# Patient Record
Sex: Female | Born: 1950
Health system: Southern US, Community
[De-identification: ages and names within clinical notes are randomized; demographics above are authoritative.]

## PROBLEM LIST (undated history)

## (undated) DIAGNOSIS — M26629 Arthralgia of temporomandibular joint, unspecified side: Secondary | ICD-10-CM

## (undated) DIAGNOSIS — F32A Depression, unspecified: Secondary | ICD-10-CM

## (undated) DIAGNOSIS — E119 Type 2 diabetes mellitus without complications: Secondary | ICD-10-CM

## (undated) DIAGNOSIS — F329 Major depressive disorder, single episode, unspecified: Secondary | ICD-10-CM

## (undated) DIAGNOSIS — M797 Fibromyalgia: Secondary | ICD-10-CM

## (undated) DIAGNOSIS — I10 Essential (primary) hypertension: Secondary | ICD-10-CM

## (undated) DIAGNOSIS — J329 Chronic sinusitis, unspecified: Secondary | ICD-10-CM

## (undated) DIAGNOSIS — K8020 Calculus of gallbladder without cholecystitis without obstruction: Secondary | ICD-10-CM

## (undated) DIAGNOSIS — G43909 Migraine, unspecified, not intractable, without status migrainosus: Secondary | ICD-10-CM

## (undated) DIAGNOSIS — Z86010 Personal history of colonic polyps: Secondary | ICD-10-CM

## (undated) DIAGNOSIS — K8050 Calculus of bile duct without cholangitis or cholecystitis without obstruction: Secondary | ICD-10-CM

## (undated) DIAGNOSIS — IMO0002 Reserved for concepts with insufficient information to code with codable children: Secondary | ICD-10-CM

## (undated) DIAGNOSIS — K7689 Other specified diseases of liver: Secondary | ICD-10-CM

## (undated) DIAGNOSIS — R740 Nonspecific elevation of levels of transaminase and lactic acid dehydrogenase [LDH]: Secondary | ICD-10-CM

## (undated) DIAGNOSIS — K5730 Diverticulosis of large intestine without perforation or abscess without bleeding: Secondary | ICD-10-CM

## (undated) HISTORY — DX: Arthralgia of temporomandibular joint, unspecified side: M26.629

## (undated) HISTORY — DX: Type 2 diabetes mellitus without complications: E11.9

## (undated) HISTORY — PX: TONSILLECTOMY: SUR1361

## (undated) HISTORY — DX: Depression, unspecified: F32.A

## (undated) HISTORY — DX: Fibromyalgia: M79.7

## (undated) HISTORY — DX: Essential (primary) hypertension: I10

## (undated) HISTORY — DX: Diverticulosis of large intestine without perforation or abscess without bleeding: K57.30

## (undated) HISTORY — DX: Personal history of colonic polyps: Z86.010

## (undated) HISTORY — DX: Reserved for concepts with insufficient information to code with codable children: IMO0002

## (undated) HISTORY — DX: Nonspecific elevation of levels of transaminase and lactic acid dehydrogenase (ldh): R74.0

## (undated) HISTORY — DX: Migraine, unspecified, not intractable, without status migrainosus: G43.909

## (undated) HISTORY — DX: Chronic sinusitis, unspecified: J32.9

## (undated) HISTORY — DX: Major depressive disorder, single episode, unspecified: F32.9

## (undated) HISTORY — PX: MANDIBLE SURGERY: SHX707

## (undated) HISTORY — DX: Calculus of bile duct without cholangitis or cholecystitis without obstruction: K80.50

## (undated) HISTORY — DX: Other specified diseases of liver: K76.89

## (undated) HISTORY — PX: LUMBAR LAMINECTOMY: SHX95

## (undated) HISTORY — DX: Calculus of gallbladder without cholecystitis without obstruction: K80.20

---

## 1998-12-07 ENCOUNTER — Encounter: Admission: RE | Admit: 1998-12-07 | Discharge: 1999-03-07 | Payer: Self-pay | Admitting: Family Medicine

## 1999-03-01 ENCOUNTER — Other Ambulatory Visit: Admission: RE | Admit: 1999-03-01 | Discharge: 1999-03-01 | Payer: Self-pay | Admitting: Obstetrics & Gynecology

## 1999-12-13 ENCOUNTER — Encounter: Payer: Self-pay | Admitting: Family Medicine

## 1999-12-13 ENCOUNTER — Encounter: Admission: RE | Admit: 1999-12-13 | Discharge: 1999-12-13 | Payer: Self-pay | Admitting: Family Medicine

## 2000-01-22 ENCOUNTER — Encounter: Admission: RE | Admit: 2000-01-22 | Discharge: 2000-02-26 | Payer: Self-pay | Admitting: Neurology

## 2005-02-14 ENCOUNTER — Ambulatory Visit (HOSPITAL_COMMUNITY): Admission: RE | Admit: 2005-02-14 | Discharge: 2005-02-14 | Payer: Self-pay | Admitting: Family Medicine

## 2005-02-14 ENCOUNTER — Encounter: Payer: Self-pay | Admitting: Family Medicine

## 2005-02-16 ENCOUNTER — Ambulatory Visit (HOSPITAL_COMMUNITY): Admission: RE | Admit: 2005-02-16 | Discharge: 2005-02-16 | Payer: Self-pay | Admitting: Family Medicine

## 2005-02-16 ENCOUNTER — Encounter: Payer: Self-pay | Admitting: Family Medicine

## 2005-08-16 ENCOUNTER — Ambulatory Visit: Payer: Self-pay | Admitting: Family Medicine

## 2005-09-06 ENCOUNTER — Ambulatory Visit: Payer: Self-pay | Admitting: Family Medicine

## 2005-10-17 ENCOUNTER — Ambulatory Visit: Payer: Self-pay | Admitting: Family Medicine

## 2005-10-17 ENCOUNTER — Other Ambulatory Visit: Admission: RE | Admit: 2005-10-17 | Discharge: 2005-10-17 | Payer: Self-pay | Admitting: Family Medicine

## 2005-10-17 ENCOUNTER — Encounter: Payer: Self-pay | Admitting: Family Medicine

## 2005-11-01 ENCOUNTER — Ambulatory Visit: Payer: Self-pay | Admitting: Internal Medicine

## 2005-11-01 ENCOUNTER — Encounter: Admission: RE | Admit: 2005-11-01 | Discharge: 2005-11-01 | Payer: Self-pay | Admitting: Family Medicine

## 2005-11-15 ENCOUNTER — Encounter (INDEPENDENT_AMBULATORY_CARE_PROVIDER_SITE_OTHER): Payer: Self-pay | Admitting: Specialist

## 2005-11-15 ENCOUNTER — Ambulatory Visit: Payer: Self-pay | Admitting: Internal Medicine

## 2005-11-15 LAB — HM COLONOSCOPY

## 2006-06-05 ENCOUNTER — Ambulatory Visit: Payer: Self-pay | Admitting: Family Medicine

## 2006-08-21 ENCOUNTER — Ambulatory Visit: Payer: Self-pay | Admitting: Cardiology

## 2006-08-21 ENCOUNTER — Ambulatory Visit: Payer: Self-pay | Admitting: Family Medicine

## 2006-08-21 LAB — CONVERTED CEMR LAB
Albumin: 3.2 g/dL — ABNORMAL LOW (ref 3.5–5.2)
Alkaline Phosphatase: 123 units/L — ABNORMAL HIGH (ref 39–117)
BUN: 9 mg/dL (ref 6–23)
Basophils Absolute: 0.2 10*3/uL — ABNORMAL HIGH (ref 0.0–0.1)
Basophils Relative: 2 % — ABNORMAL HIGH (ref 0.0–1.0)
Bilirubin, Direct: 0.1 mg/dL (ref 0.0–0.3)
CO2: 30 meq/L (ref 19–32)
Calcium: 9.1 mg/dL (ref 8.4–10.5)
Eosinophils Absolute: 0.2 10*3/uL (ref 0.0–0.6)
HCT: 38.3 % (ref 36.0–46.0)
Hgb A1c MFr Bld: 7.6 % — ABNORMAL HIGH (ref 4.6–6.0)
Lymphocytes Relative: 24.3 % (ref 12.0–46.0)
MCHC: 34.2 g/dL (ref 30.0–36.0)
Monocytes Absolute: 0.6 10*3/uL (ref 0.2–0.7)
Monocytes Relative: 5.1 % (ref 3.0–11.0)
Neutro Abs: 7.4 10*3/uL (ref 1.4–7.7)
RBC: 4.47 M/uL (ref 3.87–5.11)
Sed Rate: 33 mm/hr — ABNORMAL HIGH (ref 0–25)
Sodium: 137 meq/L (ref 135–145)

## 2006-12-03 DIAGNOSIS — IMO0001 Reserved for inherently not codable concepts without codable children: Secondary | ICD-10-CM

## 2006-12-03 DIAGNOSIS — J329 Chronic sinusitis, unspecified: Secondary | ICD-10-CM

## 2006-12-03 DIAGNOSIS — Z8719 Personal history of other diseases of the digestive system: Secondary | ICD-10-CM | POA: Insufficient documentation

## 2006-12-03 DIAGNOSIS — M26609 Unspecified temporomandibular joint disorder, unspecified side: Secondary | ICD-10-CM | POA: Insufficient documentation

## 2006-12-03 DIAGNOSIS — E119 Type 2 diabetes mellitus without complications: Secondary | ICD-10-CM | POA: Insufficient documentation

## 2006-12-03 DIAGNOSIS — G43909 Migraine, unspecified, not intractable, without status migrainosus: Secondary | ICD-10-CM

## 2006-12-03 DIAGNOSIS — Z9889 Other specified postprocedural states: Secondary | ICD-10-CM

## 2006-12-03 DIAGNOSIS — I10 Essential (primary) hypertension: Secondary | ICD-10-CM | POA: Insufficient documentation

## 2006-12-03 HISTORY — DX: Chronic sinusitis, unspecified: J32.9

## 2006-12-03 HISTORY — DX: Migraine, unspecified, not intractable, without status migrainosus: G43.909

## 2006-12-03 HISTORY — DX: Type 2 diabetes mellitus without complications: E11.9

## 2007-05-09 ENCOUNTER — Telehealth (INDEPENDENT_AMBULATORY_CARE_PROVIDER_SITE_OTHER): Payer: Self-pay | Admitting: *Deleted

## 2007-06-04 ENCOUNTER — Encounter: Payer: Self-pay | Admitting: Family Medicine

## 2007-06-11 ENCOUNTER — Other Ambulatory Visit: Admission: RE | Admit: 2007-06-11 | Discharge: 2007-06-11 | Payer: Self-pay | Admitting: Family Medicine

## 2007-06-11 ENCOUNTER — Encounter: Payer: Self-pay | Admitting: Family Medicine

## 2007-06-11 ENCOUNTER — Ambulatory Visit: Payer: Self-pay | Admitting: Family Medicine

## 2007-06-11 DIAGNOSIS — F329 Major depressive disorder, single episode, unspecified: Secondary | ICD-10-CM

## 2007-06-11 DIAGNOSIS — F3289 Other specified depressive episodes: Secondary | ICD-10-CM | POA: Insufficient documentation

## 2007-06-11 LAB — CONVERTED CEMR LAB
Bilirubin Urine: NEGATIVE
Glucose, Urine, Semiquant: NEGATIVE
Ketones, urine, test strip: NEGATIVE
WBC Urine, dipstick: NEGATIVE

## 2007-06-18 ENCOUNTER — Telehealth (INDEPENDENT_AMBULATORY_CARE_PROVIDER_SITE_OTHER): Payer: Self-pay | Admitting: *Deleted

## 2007-06-18 ENCOUNTER — Encounter (INDEPENDENT_AMBULATORY_CARE_PROVIDER_SITE_OTHER): Payer: Self-pay | Admitting: *Deleted

## 2007-06-19 ENCOUNTER — Encounter (INDEPENDENT_AMBULATORY_CARE_PROVIDER_SITE_OTHER): Payer: Self-pay | Admitting: *Deleted

## 2007-06-19 LAB — CONVERTED CEMR LAB
ALT: 21 units/L (ref 0–35)
Alkaline Phosphatase: 116 units/L (ref 39–117)
BUN: 16 mg/dL (ref 6–23)
Basophils Relative: 0.5 % (ref 0.0–1.0)
Bilirubin, Direct: 0.1 mg/dL (ref 0.0–0.3)
Chloride: 99 meq/L (ref 96–112)
Cholesterol: 166 mg/dL (ref 0–200)
Creatinine, Ser: 0.8 mg/dL (ref 0.4–1.2)
Eosinophils Absolute: 0.2 10*3/uL (ref 0.0–0.6)
Eosinophils Relative: 1.5 % (ref 0.0–5.0)
GFR calc Af Amer: 95 mL/min
Glucose, Bld: 128 mg/dL — ABNORMAL HIGH (ref 70–99)
HDL: 38.4 mg/dL — ABNORMAL LOW (ref >39.0)
Hemoglobin: 13.6 g/dL (ref 12.0–15.0)
LDL Cholesterol: 99 mg/dL (ref 0–99)
MCHC: 34.3 g/dL (ref 30.0–36.0)
Microalb Creat Ratio: 13.4 mg/g (ref 0.0–30.0)
Monocytes Relative: 5.7 % (ref 3.0–11.0)
Neutro Abs: 8 10*3/uL — ABNORMAL HIGH (ref 1.4–7.7)
Platelets: 473 10*3/uL — ABNORMAL HIGH (ref 150–400)
Potassium: 4.6 meq/L (ref 3.5–5.1)
RBC: 4.58 M/uL (ref 3.87–5.11)
Sodium: 138 meq/L (ref 135–145)
TSH: 2.09 microintl units/mL (ref 0.35–5.50)
Total CHOL/HDL Ratio: 4.3
Triglycerides: 141 mg/dL (ref 0–149)
VLDL: 28 mg/dL (ref 0–40)

## 2007-06-24 ENCOUNTER — Telehealth (INDEPENDENT_AMBULATORY_CARE_PROVIDER_SITE_OTHER): Payer: Self-pay | Admitting: *Deleted

## 2007-06-26 ENCOUNTER — Telehealth (INDEPENDENT_AMBULATORY_CARE_PROVIDER_SITE_OTHER): Payer: Self-pay | Admitting: *Deleted

## 2007-07-14 ENCOUNTER — Telehealth (INDEPENDENT_AMBULATORY_CARE_PROVIDER_SITE_OTHER): Payer: Self-pay | Admitting: *Deleted

## 2007-07-24 ENCOUNTER — Telehealth (INDEPENDENT_AMBULATORY_CARE_PROVIDER_SITE_OTHER): Payer: Self-pay | Admitting: *Deleted

## 2007-07-29 ENCOUNTER — Telehealth (INDEPENDENT_AMBULATORY_CARE_PROVIDER_SITE_OTHER): Payer: Self-pay | Admitting: *Deleted

## 2007-08-11 ENCOUNTER — Telehealth (INDEPENDENT_AMBULATORY_CARE_PROVIDER_SITE_OTHER): Payer: Self-pay | Admitting: *Deleted

## 2007-08-18 ENCOUNTER — Telehealth: Payer: Self-pay | Admitting: Family Medicine

## 2007-08-20 ENCOUNTER — Telehealth (INDEPENDENT_AMBULATORY_CARE_PROVIDER_SITE_OTHER): Payer: Self-pay | Admitting: *Deleted

## 2007-08-26 ENCOUNTER — Telehealth (INDEPENDENT_AMBULATORY_CARE_PROVIDER_SITE_OTHER): Payer: Self-pay | Admitting: *Deleted

## 2007-08-28 ENCOUNTER — Telehealth: Payer: Self-pay | Admitting: Family Medicine

## 2007-08-28 ENCOUNTER — Telehealth (INDEPENDENT_AMBULATORY_CARE_PROVIDER_SITE_OTHER): Payer: Self-pay | Admitting: *Deleted

## 2007-09-10 ENCOUNTER — Ambulatory Visit: Payer: Self-pay | Admitting: Family Medicine

## 2007-09-10 DIAGNOSIS — J069 Acute upper respiratory infection, unspecified: Secondary | ICD-10-CM | POA: Insufficient documentation

## 2007-09-10 DIAGNOSIS — M542 Cervicalgia: Secondary | ICD-10-CM

## 2007-09-10 DIAGNOSIS — R300 Dysuria: Secondary | ICD-10-CM

## 2007-09-11 LAB — CONVERTED CEMR LAB
Bilirubin, Direct: 0.1 mg/dL (ref 0.0–0.3)
CO2: 30 meq/L (ref 19–32)
Cholesterol: 139 mg/dL (ref 0–200)
Creatinine, Ser: 0.6 mg/dL (ref 0.4–1.2)
Creatinine,U: 65.3 mg/dL
GFR calc Af Amer: 132 mL/min
GFR calc non Af Amer: 109 mL/min
HDL: 37 mg/dL — ABNORMAL LOW (ref >39.0)
LDL Cholesterol: 63 mg/dL (ref 0–99)
Microalb Creat Ratio: 33.7 mg/g — ABNORMAL HIGH (ref 0.0–30.0)
Microalb, Ur: 2.2 mg/dL — ABNORMAL HIGH (ref 0.0–1.9)
Potassium: 3.7 meq/L (ref 3.5–5.1)
Sodium: 137 meq/L (ref 135–145)
Total Bilirubin: 0.6 mg/dL (ref 0.3–1.2)

## 2007-09-22 ENCOUNTER — Telehealth: Payer: Self-pay | Admitting: Family Medicine

## 2007-10-17 ENCOUNTER — Encounter: Payer: Self-pay | Admitting: Family Medicine

## 2007-12-04 ENCOUNTER — Telehealth (INDEPENDENT_AMBULATORY_CARE_PROVIDER_SITE_OTHER): Payer: Self-pay | Admitting: *Deleted

## 2008-01-07 ENCOUNTER — Telehealth (INDEPENDENT_AMBULATORY_CARE_PROVIDER_SITE_OTHER): Payer: Self-pay | Admitting: *Deleted

## 2008-06-02 ENCOUNTER — Telehealth (INDEPENDENT_AMBULATORY_CARE_PROVIDER_SITE_OTHER): Payer: Self-pay | Admitting: *Deleted

## 2008-06-06 ENCOUNTER — Telehealth (INDEPENDENT_AMBULATORY_CARE_PROVIDER_SITE_OTHER): Payer: Self-pay | Admitting: *Deleted

## 2008-07-01 ENCOUNTER — Telehealth (INDEPENDENT_AMBULATORY_CARE_PROVIDER_SITE_OTHER): Payer: Self-pay | Admitting: *Deleted

## 2008-09-09 ENCOUNTER — Telehealth (INDEPENDENT_AMBULATORY_CARE_PROVIDER_SITE_OTHER): Payer: Self-pay | Admitting: *Deleted

## 2008-09-30 ENCOUNTER — Encounter (INDEPENDENT_AMBULATORY_CARE_PROVIDER_SITE_OTHER): Payer: Self-pay | Admitting: *Deleted

## 2008-11-05 ENCOUNTER — Telehealth (INDEPENDENT_AMBULATORY_CARE_PROVIDER_SITE_OTHER): Payer: Self-pay | Admitting: *Deleted

## 2008-11-05 ENCOUNTER — Encounter (INDEPENDENT_AMBULATORY_CARE_PROVIDER_SITE_OTHER): Payer: Self-pay | Admitting: *Deleted

## 2008-11-22 ENCOUNTER — Telehealth (INDEPENDENT_AMBULATORY_CARE_PROVIDER_SITE_OTHER): Payer: Self-pay | Admitting: *Deleted

## 2008-11-30 ENCOUNTER — Telehealth (INDEPENDENT_AMBULATORY_CARE_PROVIDER_SITE_OTHER): Payer: Self-pay | Admitting: *Deleted

## 2008-12-06 ENCOUNTER — Encounter: Payer: Self-pay | Admitting: Family Medicine

## 2008-12-06 ENCOUNTER — Ambulatory Visit: Payer: Self-pay | Admitting: Family Medicine

## 2008-12-06 ENCOUNTER — Telehealth (INDEPENDENT_AMBULATORY_CARE_PROVIDER_SITE_OTHER): Payer: Self-pay | Admitting: *Deleted

## 2008-12-07 ENCOUNTER — Encounter (INDEPENDENT_AMBULATORY_CARE_PROVIDER_SITE_OTHER): Payer: Self-pay | Admitting: *Deleted

## 2008-12-22 LAB — CONVERTED CEMR LAB
ALT: 27 units/L (ref 0–35)
AST: 25 units/L (ref 0–37)
Albumin: 3.7 g/dL (ref 3.5–5.2)
Alkaline Phosphatase: 101 units/L (ref 39–117)
BUN: 17 mg/dL (ref 6–23)
Basophils Relative: 0.7 % (ref 0.0–3.0)
CO2: 26 meq/L (ref 19–32)
Calcium: 9.1 mg/dL (ref 8.4–10.5)
Creatinine, Ser: 0.8 mg/dL (ref 0.4–1.2)
Creatinine,U: 202 mg/dL
Eosinophils Absolute: 0.2 10*3/uL (ref 0.0–0.7)
Eosinophils Relative: 2.5 % (ref 0.0–5.0)
Folate: 14.7 ng/mL
Glucose, Bld: 271 mg/dL — ABNORMAL HIGH (ref 70–99)
Lymphocytes Relative: 24.6 % (ref 12.0–46.0)
MCV: 87.8 fL (ref 78.0–100.0)
Microalb Creat Ratio: 23.3 mg/g (ref 0.0–30.0)
Monocytes Relative: 6.6 % (ref 3.0–12.0)
Neutrophils Relative %: 65.6 % (ref 43.0–77.0)
Platelets: 346 10*3/uL (ref 150.0–400.0)
Vitamin B-12: 170 pg/mL — ABNORMAL LOW (ref 211–911)
WBC: 8.9 10*3/uL (ref 4.5–10.5)

## 2008-12-23 ENCOUNTER — Telehealth (INDEPENDENT_AMBULATORY_CARE_PROVIDER_SITE_OTHER): Payer: Self-pay | Admitting: *Deleted

## 2008-12-24 ENCOUNTER — Telehealth (INDEPENDENT_AMBULATORY_CARE_PROVIDER_SITE_OTHER): Payer: Self-pay | Admitting: *Deleted

## 2009-01-05 ENCOUNTER — Encounter (INDEPENDENT_AMBULATORY_CARE_PROVIDER_SITE_OTHER): Payer: Self-pay | Admitting: *Deleted

## 2009-02-01 ENCOUNTER — Telehealth (INDEPENDENT_AMBULATORY_CARE_PROVIDER_SITE_OTHER): Payer: Self-pay | Admitting: *Deleted

## 2009-03-14 ENCOUNTER — Telehealth (INDEPENDENT_AMBULATORY_CARE_PROVIDER_SITE_OTHER): Payer: Self-pay | Admitting: *Deleted

## 2009-03-16 ENCOUNTER — Telehealth: Payer: Self-pay | Admitting: Family Medicine

## 2009-06-07 ENCOUNTER — Telehealth (INDEPENDENT_AMBULATORY_CARE_PROVIDER_SITE_OTHER): Payer: Self-pay | Admitting: *Deleted

## 2009-07-26 ENCOUNTER — Telehealth (INDEPENDENT_AMBULATORY_CARE_PROVIDER_SITE_OTHER): Payer: Self-pay | Admitting: *Deleted

## 2009-07-29 ENCOUNTER — Telehealth (INDEPENDENT_AMBULATORY_CARE_PROVIDER_SITE_OTHER): Payer: Self-pay | Admitting: *Deleted

## 2009-08-01 ENCOUNTER — Telehealth: Payer: Self-pay | Admitting: Family Medicine

## 2009-08-09 ENCOUNTER — Encounter (INDEPENDENT_AMBULATORY_CARE_PROVIDER_SITE_OTHER): Payer: Self-pay | Admitting: *Deleted

## 2009-08-09 ENCOUNTER — Telehealth (INDEPENDENT_AMBULATORY_CARE_PROVIDER_SITE_OTHER): Payer: Self-pay | Admitting: *Deleted

## 2009-08-26 ENCOUNTER — Telehealth (INDEPENDENT_AMBULATORY_CARE_PROVIDER_SITE_OTHER): Payer: Self-pay | Admitting: *Deleted

## 2009-09-05 ENCOUNTER — Ambulatory Visit (HOSPITAL_BASED_OUTPATIENT_CLINIC_OR_DEPARTMENT_OTHER): Admission: RE | Admit: 2009-09-05 | Discharge: 2009-09-05 | Payer: Self-pay | Admitting: Family Medicine

## 2009-09-05 ENCOUNTER — Ambulatory Visit: Payer: Self-pay | Admitting: Radiology

## 2009-09-05 ENCOUNTER — Ambulatory Visit: Payer: Self-pay | Admitting: Family Medicine

## 2009-09-05 ENCOUNTER — Telehealth (INDEPENDENT_AMBULATORY_CARE_PROVIDER_SITE_OTHER): Payer: Self-pay | Admitting: *Deleted

## 2009-09-05 DIAGNOSIS — R740 Nonspecific elevation of levels of transaminase and lactic acid dehydrogenase [LDH]: Secondary | ICD-10-CM

## 2009-09-05 DIAGNOSIS — IMO0002 Reserved for concepts with insufficient information to code with codable children: Secondary | ICD-10-CM | POA: Insufficient documentation

## 2009-09-05 DIAGNOSIS — R7401 Elevation of levels of liver transaminase levels: Secondary | ICD-10-CM

## 2009-09-05 HISTORY — DX: Reserved for concepts with insufficient information to code with codable children: IMO0002

## 2009-09-05 HISTORY — DX: Elevation of levels of liver transaminase levels: R74.01

## 2009-09-05 LAB — CONVERTED CEMR LAB
AST: 30 units/L (ref 0–37)
Albumin: 3.5 g/dL (ref 3.5–5.2)
Basophils Absolute: 0.1 10*3/uL (ref 0.0–0.1)
Basophils Relative: 0.9 % (ref 0.0–3.0)
Eosinophils Absolute: 0.3 10*3/uL (ref 0.0–0.7)
Eosinophils Relative: 3.1 % (ref 0.0–5.0)
Glucose, Bld: 208 mg/dL — ABNORMAL HIGH (ref 70–99)
LDL Cholesterol: 38 mg/dL (ref 0–99)
MCHC: 33.2 g/dL (ref 30.0–36.0)
MCV: 88.7 fL (ref 78.0–100.0)
Microalb, Ur: 1.2 mg/dL (ref 0.0–1.9)
Monocytes Absolute: 0.5 10*3/uL (ref 0.1–1.0)
Potassium: 4.6 meq/L (ref 3.5–5.1)
RBC: 4.48 M/uL (ref 3.87–5.11)
Total Protein: 7.3 g/dL (ref 6.0–8.3)
Triglycerides: 148 mg/dL (ref 0.0–149.0)
VLDL: 29.6 mg/dL (ref 0.0–40.0)
WBC: 9.2 10*3/uL (ref 4.5–10.5)

## 2009-09-07 ENCOUNTER — Telehealth (INDEPENDENT_AMBULATORY_CARE_PROVIDER_SITE_OTHER): Payer: Self-pay | Admitting: *Deleted

## 2009-09-13 ENCOUNTER — Telehealth (INDEPENDENT_AMBULATORY_CARE_PROVIDER_SITE_OTHER): Payer: Self-pay | Admitting: *Deleted

## 2009-11-17 ENCOUNTER — Telehealth: Payer: Self-pay | Admitting: Family Medicine

## 2009-12-06 ENCOUNTER — Encounter: Payer: Self-pay | Admitting: Family Medicine

## 2009-12-21 ENCOUNTER — Telehealth (INDEPENDENT_AMBULATORY_CARE_PROVIDER_SITE_OTHER): Payer: Self-pay | Admitting: *Deleted

## 2010-01-03 ENCOUNTER — Ambulatory Visit: Payer: Self-pay | Admitting: Endocrinology

## 2010-05-30 ENCOUNTER — Telehealth (INDEPENDENT_AMBULATORY_CARE_PROVIDER_SITE_OTHER): Payer: Self-pay | Admitting: *Deleted

## 2010-06-02 ENCOUNTER — Telehealth: Payer: Self-pay | Admitting: Endocrinology

## 2010-06-30 ENCOUNTER — Encounter: Payer: Self-pay | Admitting: Family Medicine

## 2010-06-30 ENCOUNTER — Encounter: Payer: Self-pay | Admitting: Endocrinology

## 2010-06-30 ENCOUNTER — Ambulatory Visit: Payer: Self-pay | Admitting: Family

## 2010-07-03 ENCOUNTER — Encounter: Payer: Self-pay | Admitting: Family Medicine

## 2010-07-04 ENCOUNTER — Ambulatory Visit: Payer: Self-pay | Admitting: Family Medicine

## 2010-07-05 ENCOUNTER — Encounter: Payer: Self-pay | Admitting: Family Medicine

## 2010-07-05 ENCOUNTER — Ambulatory Visit: Payer: Self-pay | Admitting: Family Medicine

## 2010-07-06 ENCOUNTER — Encounter (INDEPENDENT_AMBULATORY_CARE_PROVIDER_SITE_OTHER): Payer: Self-pay | Admitting: *Deleted

## 2010-07-06 LAB — CONVERTED CEMR LAB
ALT: 78 units/L — ABNORMAL HIGH (ref 0–35)
AST: 46 units/L — ABNORMAL HIGH (ref 0–37)
Basophils Absolute: 0.1 10*3/uL (ref 0.0–0.1)
CO2: 27 meq/L (ref 19–32)
Chloride: 96 meq/L (ref 96–112)
Ferritin: 46 ng/mL (ref 10.0–291.0)
Glucose, Bld: 294 mg/dL — ABNORMAL HIGH (ref 70–99)
Hemoglobin: 13.3 g/dL (ref 12.0–15.0)
Hgb A1c MFr Bld: 8.2 % — ABNORMAL HIGH (ref 4.6–6.5)
Iron: 77 ug/dL (ref 42–145)
Lymphocytes Relative: 17.8 % (ref 12.0–46.0)
Lymphs Abs: 2.2 10*3/uL (ref 0.7–4.0)
MCHC: 33.7 g/dL (ref 30.0–36.0)
Monocytes Relative: 4.5 % (ref 3.0–12.0)
Neutrophils Relative %: 74.6 % (ref 43.0–77.0)
Potassium: 5 meq/L (ref 3.5–5.1)
RDW: 14.5 % (ref 11.5–14.6)
Sodium: 134 meq/L — ABNORMAL LOW (ref 135–145)
Total Bilirubin: 0.9 mg/dL (ref 0.3–1.2)
aPTT: 29.5 s — ABNORMAL HIGH (ref 21.7–28.8)

## 2010-07-07 LAB — CONVERTED CEMR LAB
Hep B C IgM: NEGATIVE
Hepatitis B Surface Ag: NEGATIVE

## 2010-07-20 ENCOUNTER — Ambulatory Visit
Admission: RE | Admit: 2010-07-20 | Discharge: 2010-07-20 | Payer: Self-pay | Source: Home / Self Care | Attending: Endocrinology | Admitting: Endocrinology

## 2010-07-20 ENCOUNTER — Other Ambulatory Visit: Payer: Self-pay | Admitting: Endocrinology

## 2010-07-20 ENCOUNTER — Encounter: Payer: Self-pay | Admitting: Endocrinology

## 2010-07-20 DIAGNOSIS — R109 Unspecified abdominal pain: Secondary | ICD-10-CM | POA: Insufficient documentation

## 2010-07-20 LAB — BASIC METABOLIC PANEL
BUN: 16 mg/dL (ref 6–23)
CO2: 25 mEq/L (ref 19–32)
Calcium: 9.3 mg/dL (ref 8.4–10.5)
Chloride: 97 mEq/L (ref 96–112)
Creatinine, Ser: 0.8 mg/dL (ref 0.4–1.2)
GFR: 77.44 mL/min (ref >60.00)
Glucose, Bld: 329 mg/dL — ABNORMAL HIGH (ref 70–99)
Potassium: 4.6 mEq/L (ref 3.5–5.1)
Sodium: 132 mEq/L — ABNORMAL LOW (ref 135–145)

## 2010-07-20 LAB — HEPATIC FUNCTION PANEL
ALT: 123 U/L — ABNORMAL HIGH (ref 0–35)
AST: 72 U/L — ABNORMAL HIGH (ref 0–37)
Albumin: 3.1 g/dL — ABNORMAL LOW (ref 3.5–5.2)
Alkaline Phosphatase: 392 U/L — ABNORMAL HIGH (ref 39–117)
Bilirubin, Direct: 0.7 mg/dL — ABNORMAL HIGH (ref 0.0–0.3)
Total Bilirubin: 1.4 mg/dL — ABNORMAL HIGH (ref 0.3–1.2)
Total Protein: 6.8 g/dL (ref 6.0–8.3)

## 2010-07-20 LAB — CBC WITH DIFFERENTIAL/PLATELET
Basophils Absolute: 0 10*3/uL (ref 0.0–0.1)
Basophils Relative: 0.1 % (ref 0.0–3.0)
Eosinophils Absolute: 0.2 10*3/uL (ref 0.0–0.7)
Eosinophils Relative: 1.6 % (ref 0.0–5.0)
HCT: 37 % (ref 36.0–46.0)
Hemoglobin: 12.7 g/dL (ref 12.0–15.0)
Lymphocytes Relative: 14.3 % (ref 12.0–46.0)
Lymphs Abs: 1.6 10*3/uL (ref 0.7–4.0)
MCHC: 34.4 g/dL (ref 30.0–36.0)
MCV: 88.5 fl (ref 78.0–100.0)
Monocytes Absolute: 0.4 10*3/uL (ref 0.1–1.0)
Monocytes Relative: 3.8 % (ref 3.0–12.0)
Neutro Abs: 9.1 10*3/uL — ABNORMAL HIGH (ref 1.4–7.7)
Neutrophils Relative %: 80.2 % — ABNORMAL HIGH (ref 43.0–77.0)
Platelets: 395 10*3/uL (ref 150.0–400.0)
RBC: 4.18 Mil/uL (ref 3.87–5.11)
RDW: 14.5 % (ref 11.5–14.6)
WBC: 11.3 10*3/uL — ABNORMAL HIGH (ref 4.5–10.5)

## 2010-07-20 LAB — AMYLASE: Amylase: 26 U/L — ABNORMAL LOW (ref 27–131)

## 2010-07-20 LAB — URINALYSIS, ROUTINE W REFLEX MICROSCOPIC
Bilirubin Urine: NEGATIVE
Hemoglobin, Urine: NEGATIVE
Ketones, ur: NEGATIVE
Nitrite: NEGATIVE
Specific Gravity, Urine: <=1.005 (ref 1.000–1.030)
Total Protein, Urine: NEGATIVE
Urine Glucose: >=1000
Urobilinogen, UA: 0.2 (ref 0.0–1.0)
pH: 5.5 (ref 5.0–8.0)

## 2010-07-20 LAB — HEMOGLOBIN A1C: Hgb A1c MFr Bld: 9.1 % — ABNORMAL HIGH (ref 4.6–6.5)

## 2010-07-27 ENCOUNTER — Encounter
Admission: RE | Admit: 2010-07-27 | Discharge: 2010-07-27 | Payer: Self-pay | Source: Home / Self Care | Attending: Endocrinology | Admitting: Endocrinology

## 2010-07-28 ENCOUNTER — Telehealth: Payer: Self-pay | Admitting: Family Medicine

## 2010-07-28 ENCOUNTER — Encounter: Payer: Self-pay | Admitting: Family Medicine

## 2010-07-28 DIAGNOSIS — K8020 Calculus of gallbladder without cholecystitis without obstruction: Secondary | ICD-10-CM

## 2010-07-28 HISTORY — DX: Calculus of gallbladder without cholecystitis without obstruction: K80.20

## 2010-08-03 ENCOUNTER — Encounter: Payer: Self-pay | Admitting: Internal Medicine

## 2010-08-04 ENCOUNTER — Encounter: Payer: Self-pay | Admitting: Internal Medicine

## 2010-08-04 DIAGNOSIS — K7689 Other specified diseases of liver: Secondary | ICD-10-CM

## 2010-08-04 DIAGNOSIS — K8050 Calculus of bile duct without cholangitis or cholecystitis without obstruction: Secondary | ICD-10-CM

## 2010-08-04 DIAGNOSIS — K5730 Diverticulosis of large intestine without perforation or abscess without bleeding: Secondary | ICD-10-CM

## 2010-08-04 DIAGNOSIS — Z860100 Personal history of colon polyps, unspecified: Secondary | ICD-10-CM | POA: Insufficient documentation

## 2010-08-04 HISTORY — DX: Personal history of colonic polyps: Z86.010

## 2010-08-04 HISTORY — DX: Personal history of colon polyps, unspecified: Z86.0100

## 2010-08-04 HISTORY — DX: Diverticulosis of large intestine without perforation or abscess without bleeding: K57.30

## 2010-08-04 HISTORY — DX: Other specified diseases of liver: K76.89

## 2010-08-04 HISTORY — DX: Calculus of bile duct without cholangitis or cholecystitis without obstruction: K80.50

## 2010-08-06 ENCOUNTER — Encounter: Payer: Self-pay | Admitting: Family Medicine

## 2010-08-07 ENCOUNTER — Encounter: Payer: Self-pay | Admitting: Internal Medicine

## 2010-08-07 ENCOUNTER — Ambulatory Visit
Admission: RE | Admit: 2010-08-07 | Discharge: 2010-08-07 | Payer: Self-pay | Source: Home / Self Care | Attending: Internal Medicine | Admitting: Internal Medicine

## 2010-08-08 ENCOUNTER — Encounter: Payer: Self-pay | Admitting: Gastroenterology

## 2010-08-08 ENCOUNTER — Ambulatory Visit (HOSPITAL_COMMUNITY)
Admission: RE | Admit: 2010-08-08 | Discharge: 2010-08-08 | Payer: Self-pay | Source: Home / Self Care | Attending: Gastroenterology | Admitting: Gastroenterology

## 2010-08-09 LAB — GLUCOSE, CAPILLARY: Glucose-Capillary: 252 mg/dL — ABNORMAL HIGH (ref 70–99)

## 2010-08-10 ENCOUNTER — Encounter: Payer: Self-pay | Admitting: Family Medicine

## 2010-08-16 ENCOUNTER — Encounter: Payer: Self-pay | Admitting: Endocrinology

## 2010-08-17 NOTE — Progress Notes (Signed)
Summary: refill -   Phone Note Refill Request Message from:  Fax from Pharmacy  Refills Requested: Medication #1:  CYCLOBENZAPRINE HCL 10 MG TABS take one tablet 3 times a day as needed received another request from [facility] - fax [phone] ----   Initial call taken by: Okey Regal Spring,  May 30, 2010 8:55 AM  Follow-up for Phone Call        spk with pharmacy and they never rcv'd the refill.... I gave them the verbal authorization to refill this one time..... Almeta Monas CMA Duncan Dull)  May 30, 2010 10:35 AM

## 2010-08-17 NOTE — Progress Notes (Signed)
Summary: Glipizide RF  Phone Note Refill Request Message from:  Scriptline on June 02, 2010 2:53 PM  Refills Requested: Medication #1:  GLIPIZIDE 10 MG TB24 TAKE ONE TABLET TWICE DAILY. Pt is seeing Dr. Everardo All last a1c was done at his office on 01/03/10 and it was 9.9. Did you want to fill RX?  [facility] [address]   Method Requested: Fax to Local Pharmacy Initial call taken by: Almeta Monas CMA Duncan Dull),  June 02, 2010 2:53 PM  Follow-up for Phone Call        no --dr Everardo All should be filling dm meds Follow-up by: Loreen Freud DO,  June 02, 2010 3:13 PM  Additional Follow-up for Phone Call Additional follow up Details #1::        I made patient aware and she stated he never prescribed this med for her, wanted to know if we can fill it this last time and she will let him know when she see him again. Additional Follow-up by: Almeta Monas CMA Duncan Dull),  June 02, 2010 3:55 PM    Additional Follow-up for Phone Call Additional follow up Details #2::    FYI--- I filled this for the patient, but further refills need should come from Dr. Everardo All per Dr.Lowne. Thank You Follow-up by: Almeta Monas CMA Duncan Dull),  June 02, 2010 5:18 PM  Prescriptions: GLIPIZIDE 10 MG TB24 (GLIPIZIDE) TAKE ONE TABLET TWICE DAILY.  #60 Tablet x 0   Entered by:   Shonna Chock CMA   Authorized by:   Loreen Freud DO   Signed by:   Shonna Chock CMA on 06/02/2010   Method used:   Electronically to        [facility] [phone]* (retail)       [address]       [place], [place]  [address]       Ph: [phone]       Fax: [phone]   RxID:   [id number]

## 2010-08-17 NOTE — Letter (Signed)
Summary: Letter Regarding GI Appt  Letter Regarding GI Appt   Imported By: Lanelle Bal 08/08/2010 11:47:54  _____________________________________________________________________  External Attachment:    Type:   Image     Comment:   External Document

## 2010-08-17 NOTE — Assessment & Plan Note (Signed)
Summary: diabetes, liver prob per [facility] visit from 12/16--se...   Vital Signs:  Patient profile:   60 year old female Weight:      251.4 pounds Temp:     97.8 degrees F oral BP sitting:   120 / 90  (right arm) Cuff size:   large  Vitals Entered By: Almeta Monas CMA Duncan Dull) (July 05, 2010 10:56 AM) CC: xfew weeks c/o abdominal pain NVD, lack of energy and itching in the hand and feet   History of Present Illness: Pt here f/u urgent care.  She had itching on palms and feet and NVD---Symptoms started about 2 weeks ago.  Pt went to [facility]--- and was found to elevated LFTs.   -- She went to urgent care because her skin was yellow and urine was dark and she was drinking fluids.  Her blood sugars were also elevated.  NVD has resolved but pt is still itching.   Pt sees Dr Everardo All in January.    Problems Prior to Update: 1)  Nonspec Elevation of Levels of Transaminase/ldh  (ICD-790.4) 2)  Neck Pain  (ICD-723.1) 3)  Back Pain With Radiculopathy  (ICD-729.2) 4)  Dysuria  (ICD-788.1) 5)  Uri  (ICD-465.9) 6)  Neck Pain  (ICD-723.1) 7)  Preventive Health Care  (ICD-V70.0) 8)  Fibromyalgia  (ICD-729.1) 9)  Family History Breast Cancer 1st Degree Relative <50  (ICD-V16.3) 10)  Family History of Alcoholism/addiction  (ICD-V61.41) 11)  Family History Diabetes 1st Degree Relative  (ICD-V18.0) 12)  Depression  (ICD-311) 13)  Colonoscopy, Hx of  (ICD-V12.79) 14)  Laminectomy, Hx of  (ICD-V45.89) 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 Two Sprays Each Nostril Daily 2)  Actos 45 Mg  Tabs (Pioglitazone Hcl) .... Take One Tablet Daily 3)  Glipizide 10 Mg Tb24 (Glipizide) .... Take One Tablet Twice Daily. 4)   Atenolol 50 Mg Tabs (Atenolol) .... Take One Tablet Daily 5)  Metformin Hcl 500 Mg Tabs (Metformin Hcl) .... Take Two Tablet Twice Daily- 6)  Cyclobenzaprine Hcl 10 Mg Tabs (Cyclobenzaprine Hcl) .... Take One Tablet 3 Times A Day As Needed 7)  Lisinopril 10 Mg Tabs (Lisinopril) .... Take One Tablet Daily- Office Viist and Labs Due For Further Refills 8)  Onetouch Ultra Test   Strp (Glucose Blood) .... Use As Directed 9)  Pravachol 40 Mg  Tabs (Pravastatin Sodium) .... Take One Tablet At Bedtime 10)  Maxalt 10 Mg Tabs (Rizatriptan Benzoate) .... Take As Directed As Needed 11)  Ultram 50 Mg Tabs (Tramadol Hcl) .Marland Kitchen.. 1-2 Every 6 Hours As Needed 12)  Promethazine Hcl 25 Mg Tabs (Promethazine Hcl) .Marland Kitchen.. 1 By Mouth Qid As Needed 13)  Onetouch Delica Lancets  Misc (Lancets) .... Acc Checks Daily 14)  Vitamin D3 1000 Unit Caps (Cholecalciferol) .Marland Kitchen.. 1 By Mouth Daily. 15)  Azithromycin 500 Mg Tabs (Azithromycin) .Marland Kitchen.. 1 Once Daily 16)  Januvia 100 Mg Tabs (Sitagliptin Phosphate) .Marland Kitchen.. 1 Each Am 17)  Welchol 625 Mg Tabs (Colesevelam Hcl) .... 6 Once Daily  Current Medications (verified): 1)  Nasacort Aq 55 Mcg/act Aers (Triamcinolone Acetonide(Nasal)) .... Two Sprays Each Nostril Daily 2)  Actos 45 Mg  Tabs (Pioglitazone Hcl) .... Take One Tablet Daily 3)  Glipizide 10 Mg Tb24 (Glipizide) .Marland KitchenMarland KitchenMarland Kitchen  Take One Tablet Twice Daily. 4)  Atenolol 50 Mg Tabs (Atenolol) .... Take One Tablet Daily 5)  Metformin Hcl 500 Mg Tabs (Metformin Hcl) .... Take Two Tablet Twice Daily- 6)  Cyclobenzaprine Hcl 10 Mg Tabs (Cyclobenzaprine Hcl) .... Take One Tablet 3 Times A Day As Needed 7)  Lisinopril 10 Mg Tabs (Lisinopril) .... Take One Tablet Daily- Office Viist and Labs Due For Further Refills 8)  Onetouch Ultra Test   Strp (Glucose Blood) .... Use As Directed 9)  Pravachol 40 Mg  Tabs (Pravastatin Sodium) .... Take One Tablet At Bedtime 10)  Maxalt 10 Mg Tabs (Rizatriptan Benzoate) .... Take As Directed As Needed 11)  Ultram  50 Mg Tabs (Tramadol Hcl) .Marland Kitchen.. 1-2 Every 6 Hours As Needed 12)  Promethazine Hcl 25 Mg Tabs (Promethazine Hcl) .Marland Kitchen.. 1 By Mouth Qid As Needed 13)  Onetouch Delica Lancets  Misc (Lancets) .... Acc Checks Daily 14)  Vitamin D3 1000 Unit Caps (Cholecalciferol) .Marland Kitchen.. 1 By Mouth Daily. 15)  Azithromycin 500 Mg Tabs (Azithromycin) .Marland Kitchen.. 1 Once Daily 16)  Januvia 100 Mg Tabs (Sitagliptin Phosphate) .Marland Kitchen.. 1 Each Am 17)  Welchol 625 Mg Tabs (Colesevelam Hcl) .... 6 Once Daily  Allergies (verified): No Known Drug Allergies  Past History:  Past medical, surgical, family and social histories (including risk factors) reviewed for relevance to current acute and chronic problems.  Past Medical History: Reviewed history from 06/11/2007 and no changes required. Diabetes mellitus, type II Hypertension Depression fibromyalgia  Past Surgical History: Reviewed history from 06/11/2007 and no changes required. Lumbar laminectomy mandibular advancement Tonsillectomy  Family History: Reviewed history from 01/03/2010 and no changes required. Family History Diabetes 1st degree relative (mother) F-- esophageal ca, etoh M-- mesothelioma, dialysis Family History of Alcoholism/Addiction Family History Breast cancer 1st degree relative 23 S Family History of Stroke M 1st degree relative <50 B pGF---  MI  Social History: Reviewed history from 01/03/2010 and no changes required. Married Former Smoker Alcohol use-no Drug use-no Regular exercise-no disabled  Review of Systems      See HPI  Physical Exam  General:  Well-developed,well-nourished,in no acute distress; alert,appropriate and cooperative throughout examination Mouth:  Oral mucosa and oropharynx without lesions or exudates.  Teeth in good repair. Neck:  No deformities, masses, or tenderness noted. Lungs:  Normal respiratory effort, chest expands symmetrically. Lungs are clear to auscultation, no crackles or wheezes. Heart:  normal rate and  no murmur.   Abdomen:  Bowel sounds positive,abdomen soft and non-tender without masses, organomegaly or hernias noted.  + obese Msk:  no joint swelling and no joint warmth.   Extremities:  No clubbing, cyanosis, edema, or deformity noted with normal full range of motion of all joints.   Skin:  Intact without suspicious lesions or rashes Cervical Nodes:  No lymphadenopathy noted Psych:  Oriented X3, memory intact for recent and remote, depressed affect, and tearful.     Impression & Recommendations:  Problem # 1:  NONSPEC ELEVATION OF LEVELS OF TRANSAMINASE/LDH (ICD-790.4)  Orders: Venipuncture (16109) TLB-BMP (Basic Metabolic Panel-BMET) (80048-METABOL) TLB-CBC Platelet - w/Differential (85025-CBCD) TLB-Hepatic/Liver Function Pnl (80076-HEPATIC) TLB-GGT (Gamma GT) (82977-GGT) TLB-IBC Pnl (Iron/FE;Transferrin) (83550-IBC) TLB-Ferritin (82728-FER) TLB-A1C / Hgb A1C (Glycohemoglobin) (83036-A1C) TLB-PT (Protime) (85610-PTP) TLB-PTT (85730-PTTL) T-Hepatitis Acute Panel (60454-09811) T- * Misc. Laboratory test [phone]) Specimen Handling (29562)  Problem # 2:  BACK PAIN WITH RADICULOPATHY (ICD-729.2)  Problem # 3:  FIBROMYALGIA (ICD-729.1)  Her updated medication list for this problem includes:    Cyclobenzaprine Hcl 10 Mg  Tabs (Cyclobenzaprine hcl) .Marland Kitchen... Take one tablet 3 times a day as needed    Ultram 50 Mg Tabs (Tramadol hcl) .Marland Kitchen... 1-2 every 6 hours as needed  Problem # 4:  HYPERTENSION (ICD-401.9)  Her updated medication list for this problem includes:    Atenolol 50 Mg Tabs (Atenolol) .Marland Kitchen... Take one tablet daily    Lisinopril 10 Mg Tabs (Lisinopril) .Marland Kitchen... Take one tablet daily- office viist and labs due for further refills  BP today: 120/90 Prior BP: 128/84 (01/03/2010)  Labs Reviewed: K+: 4.6 (09/05/2009) Creat: : 0.7 (09/05/2009)   Chol: 109 (09/05/2009)   HDL: 41.20 (09/05/2009)   LDL: 38 (09/05/2009)   TG: 148.0 (09/05/2009)  Problem # 5:  DIABETES MELLITUS, TYPE  II (ICD-250.00) per ENDO Her updated medication list for this problem includes:    Actos 45 Mg Tabs (Pioglitazone hcl) .Marland Kitchen... Take one tablet daily    Glipizide 10 Mg Tb24 (Glipizide) .Marland Kitchen... Take one tablet twice daily.    Metformin Hcl 500 Mg Tabs (Metformin hcl) .Marland Kitchen... Take two tablet twice daily-    Lisinopril 10 Mg Tabs (Lisinopril) .Marland Kitchen... Take one tablet daily- office viist and labs due for further refills    Januvia 100 Mg Tabs (Sitagliptin phosphate) .Marland Kitchen... 1 each am  Orders: Venipuncture (16109) TLB-BMP (Basic Metabolic Panel-BMET) (80048-METABOL) TLB-CBC Platelet - w/Differential (85025-CBCD) TLB-Hepatic/Liver Function Pnl (80076-HEPATIC) TLB-GGT (Gamma GT) (82977-GGT) TLB-IBC Pnl (Iron/FE;Transferrin) (83550-IBC) TLB-Ferritin (82728-FER) TLB-A1C / Hgb A1C (Glycohemoglobin) (83036-A1C) TLB-PT (Protime) (85610-PTP) TLB-PTT (85730-PTTL) T-Hepatitis Acute Panel (60454-09811) Specimen Handling (91478)  Labs Reviewed: Creat: 0.7 (09/05/2009)    Reviewed HgBA1c results: 9.9 (01/03/2010)  10.5 (09/05/2009)  Complete Medication List: 1)  Nasacort Aq 55 Mcg/act Aers (Triamcinolone acetonide(nasal)) .... Two sprays each nostril daily 2)  Actos 45 Mg Tabs (Pioglitazone hcl) .... Take one tablet daily 3)  Glipizide 10 Mg Tb24 (Glipizide) .... Take one tablet twice daily. 4)  Atenolol 50 Mg Tabs (Atenolol) .... Take one tablet daily 5)  Metformin Hcl 500 Mg Tabs (Metformin hcl) .... Take two tablet twice daily- 6)  Cyclobenzaprine Hcl 10 Mg Tabs (Cyclobenzaprine hcl) .... Take one tablet 3 times a day as needed 7)  Lisinopril 10 Mg Tabs (Lisinopril) .... Take one tablet daily- office viist and labs due for further refills 8)  Onetouch Ultra Test Strp (Glucose blood) .... Use as directed 9)  Pravachol 40 Mg Tabs (Pravastatin sodium) .... Take one tablet at bedtime 10)  Maxalt 10 Mg Tabs (Rizatriptan benzoate) .... Take as directed as needed 11)  Ultram 50 Mg Tabs (Tramadol hcl) .Marland Kitchen.. 1-2  every 6 hours as needed 12)  Promethazine Hcl 25 Mg Tabs (Promethazine hcl) .Marland Kitchen.. 1 by mouth qid as needed 13)  Onetouch Delica Lancets Misc (Lancets) .... Acc checks daily 14)  Vitamin D3 1000 Unit Caps (Cholecalciferol) .Marland Kitchen.. 1 by mouth daily. 15)  Azithromycin 500 Mg Tabs (Azithromycin) .Marland Kitchen.. 1 once daily 16)  Januvia 100 Mg Tabs (Sitagliptin phosphate) .Marland Kitchen.. 1 each am 17)  Welchol 625 Mg Tabs (Colesevelam hcl) .... 6 once daily   Orders Added: 1)  Venipuncture [36415] 2)  TLB-BMP (Basic Metabolic Panel-BMET) [80048-METABOL] 3)  TLB-CBC Platelet - w/Differential [85025-CBCD] 4)  TLB-Hepatic/Liver Function Pnl [80076-HEPATIC] 5)  TLB-GGT (Gamma GT) [82977-GGT] 6)  TLB-IBC Pnl (Iron/FE;Transferrin) [83550-IBC] 7)  TLB-Ferritin [82728-FER] 8)  TLB-A1C / Hgb A1C (Glycohemoglobin) [83036-A1C] 9)  TLB-PT (Protime) [85610-PTP] 10)  TLB-PTT [85730-PTTL] 11)  T-Hepatitis Acute Panel [80074-22940] 12)  T- * Misc. Laboratory test [phone] 13)  Specimen Handling [  99000] 14)  Est. Patient Level IV [09811]

## 2010-08-17 NOTE — Letter (Signed)
Summary: [facility]  [facility]   Imported By: Lanelle Bal 07/14/2010 15:30:02  _____________________________________________________________________  External Attachment:    Type:   Image     Comment:   External Document

## 2010-08-17 NOTE — Assessment & Plan Note (Signed)
Summary: new endo,inclusive health/diabetes/lowne/cd   Vital Signs:  Patient profile:   60 year old female Height:      64.5 inches (163.83 cm) Weight:      263 pounds (119.55 kg) BMI:     44.61 O2 Sat:      97 % on Room air Temp:     98.1 degrees F (36.72 degrees C) oral Pulse rate:   73 / minute Pulse rhythm:   regular BP sitting:   128 / 84  (left arm) Cuff size:   large  Vitals Entered By: Brenton Grills MA (January 03, 2010 2:13 PM)  O2 Flow:  Room air CC: new endo pt/pt c/o cough x 5 weeks with congestion/pt states she has been taking advil cold and sinus and a OTC cough medicine/expectorant with no relief/pt also states shei is taking OTC Zyrtec qd and Menopause Support qd/aj Is Patient Diabetic? Yes   Primary Provider:  Laury Axon  CC:  new endo pt/pt c/o cough x 5 weeks with congestion/pt states she has been taking advil cold and sinus and a OTC cough medicine/expectorant with no relief/pt also states shei is taking OTC Zyrtec qd and Menopause Support qd/aj.  History of Present Illness: pt states 11 years h/o dm.  she unaware of any chronic complications.   she has never been on insulin.  she takes 3 oral agents.  no cbg record, but states cbg's vary from 90-200's. pt says his diet is "fair," and exercise is limited by medical probs.   symptomatically, pt states 5 weeks of slight dry-quality cough in the chest, but no assoc sob.   Current Medications (verified): 1)  Nasacort Aq 55 Mcg/act Aers (Triamcinolone Acetonide(Nasal)) .... Two Sprays Each Nostril Daily 2)  Actos 45 Mg  Tabs (Pioglitazone Hcl) .... Take One Tablet Daily 3)  Glipizide 10 Mg Tb24 (Glipizide) .... Take One Tablet Twice Daily. 4)  Atenolol 50 Mg Tabs (Atenolol) .... Take One Tablet Daily 5)  Metformin Hcl 500 Mg Tabs (Metformin Hcl) .... Take Two Tablet Twice Daily- Needs To Schedule Office Visit and Labs Before Further Refills 6)  Cyclobenzaprine Hcl 10 Mg Tabs (Cyclobenzaprine Hcl) .... Take One Tablet 3  Times A Day As Needed 7)  Lisinopril 10 Mg Tabs (Lisinopril) .... Take One Tablet Daily 8)  Onetouch Ultra Test   Strp (Glucose Blood) .... Use As Directed 9)  Pravachol 40 Mg  Tabs (Pravastatin Sodium) .... Take One Tablet At Bedtime 10)  Maxalt 10 Mg Tabs (Rizatriptan Benzoate) .... Take As Directed As Needed 11)  Ultram 50 Mg Tabs (Tramadol Hcl) .Marland Kitchen.. 1-2 Every 6 Hours As Needed 12)  Promethazine Hcl 25 Mg Tabs (Promethazine Hcl) .Marland Kitchen.. 1 By Mouth Qid As Needed 13)  Onetouch Delica Lancets  Misc (Lancets) .... Acc Checks Daily 14)  Vitamin D3 1000 Unit Caps (Cholecalciferol) .Marland Kitchen.. 1 By Mouth Daily.  Allergies (verified): No Known Drug Allergies  Family History: Reviewed history from 06/11/2007 and no changes required. Family History Diabetes 1st degree relative (mother) F-- esophageal ca, etoh M-- mesothelioma, dialysis Family History of Alcoholism/Addiction Family History Breast cancer 1st degree relative 30 S Family History of Stroke M 1st degree relative <50 B pGF---  MI  Social History: Reviewed history from 06/11/2007 and no changes required. Married Former Smoker Alcohol use-no Drug use-no Regular exercise-no disabled  Review of Systems       The patient complains of weight gain and headaches.         denies chest pain,  urinary frequency, cramps, hypoglycemia, easy bruising, and rhinorrhea.  she reports fatigue, n/v, excessive diaphoresis, memory loss, and blurry vision.  depression is improved recently.   Physical Exam  General:  morbidly obese.  head: no deformity eyes: no periorbital swelling, no proptosis external nose and ears are normal mouth: no lesion seen Head:  head: no deformity eyes: no periorbital swelling, no proptosis external nose and ears are normal mouth: no lesion seen Neck:  Supple without thyroid enlargement or tenderness.  Lungs:  Clear to auscultation bilaterally. Normal respiratory effort.  Heart:  Regular rate and rhythm without murmurs  or gallops noted. Normal S1,S2.   Msk:  muscle bulk and strength are grossly normal.  no obvious joint swelling.  gait is normal and steady with a cane Pulses:  dorsalis pedis intact bilat.  no carotid bruit  Extremities:  no deformity.  no ulcer on the feet.  feet are of normal color and temp.  trace right pedal edema and trace left pedal edema.   Neurologic:  cn 2-12 grossly intact.   readily moves all 4's.   sensation is intact to touch on the feet  Skin:  normal texture and temp.  no rash.  not diaphoretic  Cervical Nodes:  No significant adenopathy.  Psych:  Alert and cooperative; normal mood and affect; normal attention span and concentration.   Additional Exam:  Hemoglobin A1C       [H]  9.9 %    Impression & Recommendations:  Problem # 1:  DIABETES MELLITUS, TYPE II (ICD-250.00) needs increased rx  Problem # 2:  DEPRESSION (ICD-311) this complicates the rx of #1  Problem # 3:  cough possibly due to acei  Medications Added to Medication List This Visit: 1)  Metformin Hcl 500 Mg Tabs (Metformin hcl) .... Take two tablet twice daily- 2)  Azithromycin 500 Mg Tabs (Azithromycin) .Marland Kitchen.. 1 once daily 3)  Januvia 100 Mg Tabs (Sitagliptin phosphate) .Marland Kitchen.. 1 each am 4)  Welchol 625 Mg Tabs (Colesevelam hcl) .... 6 once daily  Other Orders: TLB-A1C / Hgb A1C (Glycohemoglobin) (83036-A1C) Consultation Level IV (03474)  Patient Instructions: 1)  good diet and exercise habits significanly improve the control of your diabetes.  please let me know if you wish to be referred to a dietician.  high blood sugar is very risky to your health.  you should see an eye doctor every year. 2)  controlling your blood pressure and cholesterol drastically reduces the damage diabetes does to your body.  this also applies to quitting smoking.  please discuss these with your doctor.  you should take an aspirin every day, unless you have been advised by a doctor not to. 3)  we will need to take this  complex situation in stages 4)  check your blood sugar 1-2 times a day.  vary the time of day when you check, between before the 3 meals, and at bedtime.  also check if you have symptoms of your blood sugar being too high or too low.  please keep a record of the readings and bring it to your next appointment here.  please call us sooner if you are having low blood sugar episodes. 5)  blood tests are being ordered for you today.  please call [phone] to hear your test results.  options to add on to your current diabetes medications are bromocriptine, januvia, and welchol. 6)  Please schedule a follow-up appointment in 6 weeks. 7)  azithromycin 500 mg once daily 8)  you  should consider changing lisinopril to losartan.  please let me know if you would like to change.   9)  (update: i left message on phone-tree:  add januvia 100 mg each am, and welchol 6x625 mg qd.  (bromocriptine interacts with maxalt).  ret 1 month). Prescriptions: WELCHOL 625 MG TABS (COLESEVELAM HCL) 6 once daily  #180 x 11   Entered and Authorized by:   Minus Breeding MD   Signed by:   Minus Breeding MD on 01/03/2010   Method used:   [facility] [phone]* (retail)       [address]       [place], [place]  [address]       Ph: [phone]       Fax: [phone]   RxID:   [id number] JANUVIA 100 MG TABS (SITAGLIPTIN PHOSPHATE) 1 each am  #30 x 11   Entered and Authorized by:   Minus Breeding MD   Signed by:   Minus Breeding MD on 01/03/2010   Method used:   [facility]ronically to        [facility] [phone]* (retail)       [address]       [place], [place]  [address]       Ph: [phone]       Fax: [phone]   RxID:   [id number] AZITHROMYCIN 500 MG TABS (AZITHROMYCIN) 1 once daily  #6 x 0   Entered and Authorized by:   Minus Breeding MD   Signed by:   Minus Breeding MD on 01/03/2010   Method used:   Electronically to         [facility] [phone]* (retail)       [address]       [place], [place]  [address]       Ph: [phone]       Fax: [phone]   RxID:   [id number] METFORMIN HCL 500 MG TABS (METFORMIN HCL) take two tablet twice daily-  #120 x 11   Entered and Authorized by:   Minus Breeding MD   Signed by:   Minus Breeding MD on 01/03/2010   Method used:   Electronically to        [facility] [phone]* (retail)       [address]       [place], [place]  [address]       Ph: [phone]       Fax: [phone]   RxID:   [id number]

## 2010-08-17 NOTE — Progress Notes (Signed)
Summary: refill  Phone Note Refill Request Call back at Work Phone [phone] Call back at [phone] Message from:  Patient on December 21, 2009 4:32 PM  Refills Requested: Medication #1:  METFORMIN HCL 500 MG TABS take two tablet twice daily- NEEDS TO SCHEDULE OFFICE VISIT AND LABS BEFORE FURTHER REFILLS patient husband in officesaid patient only has 5 pills left which will get her to mid day tomorrow  - she had appts to see dr Everardo All but she got sick & had to cancel - her next appt with him is [phone][facility]  [facility] [address]   Method Requested: Fax to Local Pharmacy Initial call taken by: Okey Regal Spring,  December 21, 2009 4:32 PM    Prescriptions: METFORMIN HCL 500 MG TABS (METFORMIN HCL) take two tablet twice daily- NEEDS TO SCHEDULE OFFICE VISIT AND LABS BEFORE FURTHER REFILLS  #60 x 0   Entered by:   Army Fossa CMA   Authorized by:   Loreen Freud DO   Signed by:   Army Fossa CMA on 12/22/2009   Method used:   Electronically to        [facility] [phone]* (retail)       [address]       [place], [place]  [address]       Ph: [phone]       Fax: [phone]   RxID:   [id number]

## 2010-08-17 NOTE — Progress Notes (Signed)
Summary: refills  Phone Note Refill Request Message from:  Fax from Pharmacy  Refills Requested: Medication #1:  ATENOLOL 50 MG TABS TAKE ONE TABLET DAILY   Last Refilled: 06/24/2009  Medication #2:  ACTOS 45 MG  TABS TAKE ONE TABLET DAILY**LABS DUE NOW**   Last Refilled: 06/26/2009  Medication #3:  CYCLOBENZAPRINE HCL 10 MG TABS take one tablet 3 times a day as needed   Last Ref[facility]: [facility]/08/2009 [facility] [phone]   fax---[phone]  Initial call taken by: Warnell Forester,  August 01, 2009 9:32 AM  Follow-up for Phone Call        Okay to refill flexeril? - 06/08/09 #60 Army Fossa CMA  August 01, 2009 10:17 AM   Additional Follow-up for Phone Call Additional follow up Details #1::        refill x1---pt needs ov Additional Follow-up by: Loreen Freud DO,  August 01, 2009 10:41 AM    Prescriptions: CYCLOBENZAPRINE HCL 10 MG TABS (CYCLOBENZAPRINE HCL) take one tablet 3 times a day as needed  #60 Tablet x 0   Entered by:   Army Fossa CMA   Authorized by:   Loreen Freud DO   Signed by:   Army Fossa CMA on 08/01/2009   Method used:   [facility]ron[facility] to        [facility] [phone]* (retail)       [address]       [place], [place]  [address]       Ph: [phone]       Fax: [phone]   RxID:   [id number] ATENOLOL 50 MG TABS (ATENOLOL) TAKE ONE TABLET DAILY  #30 Tablet x 0   Entered by:   Army Fossa CMA   Authorized by:   Loreen Freud DO   Signed by:   Army Fossa CMA on 08/01/2009   Method used:   Electronically to        [facility] [phone]* (retail)       [address]       [place], [place]  [address]       Ph: [phone]       Fax: [phone]   RxID:   [id number] ACTOS 45 MG  TABS (PIOGLITAZONE HCL) TAKE ONE TABLET DAILY**LABS DUE NOW**  #30 Tablet x 0   Entered by:   Army Fossa CMA   Authorized by:   Loreen Freud DO   Signed by:   Army Fossa CMA on 08/01/2009   Method used:   [facility]ronically to        [facility] [phone]* (retail)       [address]       [place], [place]  [address]       Ph: [phone]       Fax: [phone]   RxID:   [id number]

## 2010-08-17 NOTE — Procedures (Signed)
Summary: COLON   Colonoscopy  Procedure date:  11/15/2005  Findings:      Location:  [facility].   Patient Name: Haley Mullins, Haley Mullins MRN:  Procedure Procedures: Colonoscopy CPT: 8592342212.  Personnel: Endoscopist: Jarome Trull L. Juanda Chance, MD.  Referred By: Loreen Freud, DO.  Exam Location: Exam performed in Outpatient Clinic. Outpatient  Patient Consent: Procedure, Alternatives, Risks and Benefits discussed, consent obtained, from patient. Consent was obtained by the RN.  Indications  Average Risk Screening Routine.  History  Current Medications: Patient is not currently taking Coumadin.  Comments: pt is a diabetic, she had an hypoglycemic episode prior to coming to [facility], which was handled over the phone by [facility] nurses and myself, see special report Pre-Exam Physical: Performed Nov 15, 2005. Entire physical exam was normal.  Comments: Pt. history reviewed/updated, physical exam performed prior to initiation of sedation? Exam Exam: Extent of exam reached: Cecum, extent intended: Cecum.  The cecum was identified by appendiceal orifice and IC valve. Colon retroflexion performed. Images taken. ASA Classification: II. Tolerance: fair, exam compromised.  Monitoring: Pulse and BP monitoring, Oximetry used. Supplemental O2 given.  Colon Prep Used Miralax for colon prep. Prep results: good.  Sedation Meds: Patient assessed and found to be appropriate for moderate (conscious) sedation. Fentanyl 75 mcg. given IV. Versed 10 mg. given IV.  Findings POLYP: Cecum, Maximum size: 3 mm. diminutive, sessile polyp. Distance from Anus 120 cm. Procedure:  biopsy without cautery, The polyp was removed piece meal. removed, retrieved, Polyp sent to pathology. ICD9: Colon Polyps: 211.3.  - NORMAL EXAM: Cecum.  - DIVERTICULOSIS: Sigmoid Colon. ICD9: Diverticulosis: 562.10. Comments: mild diverticulosis.  - NORMAL EXAM: to Rectum.   Assessment Abnormal examination, see findings above.    Diagnoses: 211.3: Colon Polyps.  562.10: Diverticulosis.   Comments: diminutive polyp ablated at the cecum Events  Unplanned Interventions: No intervention was required.  Unplanned Events: There were no complications. Plans Medication Plan: Await pathology.  Patient Education: Patient given standard instructions for: Yearly hemoccult testing recommended. Patient instructed to get routine colonoscopy every 5 years.  Comments: resume diabetic med later today Disposition: After procedure patient sent to recovery. After recovery patient sent home.   This report was created from the original endoscopy report, which was reviewed and signed by the above listed endoscopist.     FINAL DIAGNOSIS    ***MICROSCOPIC EXAMINATION AND DIAGNOSIS***    CECUM, POLYP(S): ADENOMATOUS POLYP(S). NO HIGH GRADE DYSPLASIA   OR INVASIVE MALIGNANCY IDENTIFIED.    kv   Date Reported: 11/16/2005 Havery Moros, MD   *** Electronically Signed Out By BNS ***    Clinical information   Screening (jes)    specimen(s) obtained   Colon, polyp(s), cecum    Gross Description   Received in formalin are tan, soft tissue fragments that are   submitted in toto. Number: 2   Size: 0.4 to 0.5 cm one block (LA:jes, 11/16/05)    jes/

## 2010-08-17 NOTE — Letter (Signed)
Summary: [facility] Consult Note  [facility] Consult Note   Imported By: Lamona Curl CMA (AAMA) 08/07/2010 10:49:51  _____________________________________________________________________  External Attachment:    Type:   Image     Comment:   External Document

## 2010-08-17 NOTE — Progress Notes (Signed)
Summary: Lab results   Phone Note Outgoing Call   Call placed by: Army Fossa CMA,  September 07, 2009 8:00 AM Summary of Call: Regarding lab results, LMTCB:  pt should be taking vita D 3 1000u daily  Signed by Loreen Freud DO on 09/06/2009 at 4:54 PM  Follow-up for Phone Call        [facility] per Fleet Contras  Additional Follow-up for Phone Call Additional follow up Details #1::        left detail message recent labs mailed and to give [place] a call if she has any question in regards to labs. left my extension................................Marland KitchenFelecia Deloach CMA  September 16, 2009 10:37 AM      New/Updated Medications: VITAMIN D3 1000 UNIT CAPS (CHOLECALCIFEROL) 1 by mouth daily.

## 2010-08-17 NOTE — Assessment & Plan Note (Signed)
Summary: Retained CBD Stone/dns    History of Present Illness Visit Type: Initial Consult Primary GI MD: Lina Sar MD Primary Provider: Loreen Freud, DO  Requesting Provider: Adolph Pollack, MD  Chief Complaint: Upper abd pain, nausea with vomiting, and diarrhea  History of Present Illness:   This is a 60 year old white female with upper abdominal pain , mostly right upper quadrant  of several weeks duration possibly for a year. An abdominal ultrasound in 07/27/10 showed several 4 mm stones, positive Murphy's sign, a 3 mm thick wall of the gallbladder and 15 mm common bile duct. The pancreas was not visualized. Her liver was heterogeneous, consistent with fatty liver. Her white cell count has been 10,700 with her most recent bilirubin being 2.4 and her AST being 82.She has been c/o pruritus.  Her ALT was 114 and alkaline phosphatase was 527. Her hepatitis panel was negative. Going back in her chart, her liver function tests in 2008 were normal. In February 2011, her alkaline phosphatase was elevated at 133 and ALT was 46. I saw the patient in May 2007 for a colonoscopy with findings of an adenomatous polyp of the cecum and mild diverticulosis. Patient was seen by Dr. Abbey Chatters 3 days ago and  recommended  for  ERCP prior to her cholecystectomy. She has been on Cipro 500 mg twice a day but has continued to have dyspepsia, pain and diarrhea.   GI Review of Systems    Reports abdominal pain, nausea, and  vomiting.     Location of  Abdominal pain: upper abdomen.    Denies acid reflux, belching, bloating, chest pain, dysphagia with liquids, dysphagia with solids, heartburn, loss of appetite, vomiting blood, weight loss, and  weight gain.      Reports change in bowel habits and  diarrhea.     Denies anal fissure, black tarry stools, constipation, diverticulosis, fecal incontinence, heme positive stool, hemorrhoids, irritable bowel syndrome, jaundice, light color stool, liver problems, rectal  bleeding, and  rectal pain.    Current Medications (verified): 1)  Nasacort Aq 55 Mcg/act Aers (Triamcinolone Acetonide(Nasal)) .... Two Sprays Each Nostril Daily 2)  Actos 45 Mg  Tabs (Pioglitazone Hcl) .... Take One Tablet Daily 3)  Glipizide 10 Mg Tb24 (Glipizide) .... Take One Tablet Twice Daily. 4)  Atenolol 50 Mg Tabs (Atenolol) .... Take One Tablet Daily 5)  Metformin Hcl 500 Mg Tabs (Metformin Hcl) .... Take Two Tablet Twice Daily- 6)  Cyclobenzaprine Hcl 10 Mg Tabs (Cyclobenzaprine Hcl) .... Take One Tablet 3 Times A Day As Needed 7)  Lisinopril 10 Mg Tabs (Lisinopril) .... Take One Tablet Daily 8)  Onetouch Ultra Test   Strp (Glucose Blood) .... Use As Directed 9)  Pravachol 40 Mg  Tabs (Pravastatin Sodium) .... Take One Tablet At Bedtime 10)  Maxalt 10 Mg Tabs (Rizatriptan Benzoate) .... Take As Directed As Needed 11)  Ultram 50 Mg Tabs (Tramadol Hcl) .Marland Kitchen.. 1-2 Every 6 Hours As Needed 12)  Promethazine Hcl 25 Mg Tabs (Promethazine Hcl) .Marland Kitchen.. 1 By Mouth Qid As Needed 13)  Onetouch Delica Lancets  Misc (Lancets) .... Acc Checks Daily 14)  Vitamin D3 1000 Unit Caps (Cholecalciferol) .Marland Kitchen.. 1 By Mouth Daily. 15)  Januvia 100 Mg Tabs (Sitagliptin Phosphate) .Marland Kitchen.. 1 Each Am 16)  Welchol 625 Mg Tabs (Colesevelam Hcl) .... 6 Once Daily 17)  Melatonin 5 Mg Tabs (Melatonin) .Marland Kitchen.. 1 Tablet By Mouth At Bedtime 18)  Multivitamins  Caps (Multiple Vitamin) .... 2 By Mouth Once Daily 19)  Menopause Support  Tabs (Specialty Vitamins Products) .Marland Kitchen.. 1 By Mouth Once Daily 20)  Omeprazole 40 Mg Cpdr (Omeprazole) .Marland Kitchen.. 1 Tab Once Daily 21)  Cipro 500 Mg Tabs (Ciprofloxacin Hcl) .... Take 1 Tablet By Mouth Two Times A Day X 7 Days  Allergies (verified): No Known Drug Allergies  Past History:  Past Medical History: CHOLEDOCHOLITHIASIS (ICD-574.50) FATTY LIVER DISEASE (ICD-571.8) COLONIC POLYPS, ADENOMATOUS, HX OF (ICD-V12.72) DIVERTICULOSIS OF COLON (ICD-562.10) GALLSTONES (ICD-574.20) FLANK PAIN,  RIGHT (ICD-789.09) NONSPEC ELEVATION OF LEVELS OF TRANSAMINASE/LDH (ICD-790.4) NECK PAIN (ICD-723.1) BACK PAIN WITH RADICULOPATHY (ICD-729.2) DYSURIA (ICD-788.1) URI (ICD-465.9) NECK PAIN (ICD-723.1) PREVENTIVE HEALTH CARE (ICD-V70.0) FIBROMYALGIA (ICD-729.1) FAMILY HISTORY BREAST CANCER 1ST DEGREE RELATIVE <50 (ICD-V16.3) FAMILY HISTORY OF ALCOHOLISM/ADDICTION (ICD-V61.41) FAMILY HISTORY DIABETES 1ST DEGREE RELATIVE (ICD-V18.0) DEPRESSION (ICD-311) COLONOSCOPY, HX OF (ICD-V12.79) LAMINECTOMY, HX OF (ICD-V45.89) TMJ SYNDROME (ICD-524.60) SINUSITIS (ICD-473.9) MIGRAINE HEADACHE (ICD-346.90) FIBROMYALGIA (ICD-729.1) HYPERTENSION (ICD-401.9) DIABETES MELLITUS, TYPE II (ICD-250.00)  Past Surgical History: Lumbar laminectomy Mandibular Advancement Tonsillectomy  Family History: Reviewed history from 08/04/2010 and no changes required. Family History Diabetes 1st degree relative (mother) F-- esophageal ca, etoh M-- mesothelioma, dialysis Family History of Alcoholism/Addiction Family History Breast cancer 1st degree relative 22 S Family History of Stroke M 1st degree relative <50 B pGF---  MI No FH of Colon Cancer:  Social History: Unemployed--Disabled Married Childern Former Smoker Alcohol use-no Drug use-no Regular exercise-no  Review of Systems       The patient complains of allergy/sinus, back pain, fatigue, fever, headaches-new, itching, skin rash, and sleeping problems.  The patient denies anemia, anxiety-new, arthritis/joint pain, blood in urine, breast changes/lumps, change in vision, confusion, cough, coughing up blood, depression-new, fainting, hearing problems, heart murmur, heart rhythm changes, menstrual pain, muscle pains/cramps, night sweats, nosebleeds, pregnancy symptoms, shortness of breath, sore throat, swelling of feet/legs, swollen lymph glands, thirst - excessive , urination - excessive , urination changes/pain, urine leakage, vision changes, and voice  change.         Pertinent positive and negative review of systems were noted in the above HPI. All other ROS was otherwise negative.   Vital Signs:  Patient profile:   60 year old female Height:      64.5 inches Weight:      248 pounds BMI:     42.06 BSA:     2.16 Pulse rate:   76 / minute Pulse rhythm:   regular BP sitting:   136 / 82  (left arm) Cuff size:   regular  Vitals Entered By: Ok Anis CMA (August 07, 2010 9:13 AM)  Physical Exam  General:  alert, oriented and in no distress. Overweight. Eyes:  nonicteric. Mouth:  normal. Neck:  Supple; no masses or thyromegaly. Lungs:  Clear throughout to auscultation. Heart:  Regular rate and rhythm; no murmurs, rubs,  or bruits. Abdomen:  soft obese abdomen with tenderness along her right costal margin and at about the umbilicus. Scratch marks around the umbilical area. Liver edge at costal margin. Left upper quadrant unremarkable. Low abdomen normal. Normoactive bowel sounds. Rectal:  soft Hemoccult-negative stool. Extremities:  no edema. Skin:  scratch marks on her abdomen as well as on her arms from itching. Psych:  Alert and cooperative. Normal mood and affect.   Impression & Recommendations:  Problem # 1:  CHOLEDOCHOLITHIASIS (ICD-574.50)  Patient has cholelithiasis and suspected choledocholithiasis. Suspect benign process. Low grade temp was  from  cholangitis. She is better  on Cipro 500 mg twice a day. There is no evidence of  clinical pancreatitis. She is scheduled for an ERCP tomorrow with Dr Christella Hartigan, with possible sphincterotomy and stone extraction. She will continue on Cipro. I have spoken to Dr.Rosenbower and he will try to arrange for a cholecystectomy as soon as possible.  Orders: ERCP (ERCP)  Problem # 2:  FATTY LIVER DISEASE (ICD-571.8) This was seen by ultrasound.  Problem # 3:  COLONIC POLYPS, ADENOMATOUS, HX OF (ICD-V12.72) A recall colonoscopy will be due in May 2012.  Problem # 4:  DIABETES  MELLITUS, TYPE II (ICD-250.00) Patient is followed by Dr. Everardo All. We will hold her diabetic medications prior to her ERCP tomorrow.  Patient Instructions: 1)  You have been scheduled for an ERCP with possible sphincterotomy with Dr Rob Bunting on 08/08/10 @ [facility]. Dr Juanda Chance and Dr Christella Hartigan have already discussed your case. 2)  You are scheduled for a follow up appointment with Dr Lina Sar on 09/11/10 @ 1:45 pm. 3)  Copy sent to : Dr Abbey Chatters, Dr Everardo All, Dr Christella Hartigan, Dr Loann Quill 4)  continue Cipro 500 mg p.o. b.i.d. 5)  The medication list was reviewed and reconciled.  All changed / newly prescribed medications were explained.  A complete medication list was provided to the patient / caregiver.

## 2010-08-17 NOTE — Progress Notes (Signed)
Summary: REFILL  Phone Note Refill Request Message from:  [facility] FAX [phone]  Refills Requested: Medication #1:  LISINOPRIL 10 MG TABS TAKE ONE TABLET DAILY  Medication #2:  PRAVACHOL 40 MG  TABS TAKE ONE TABLET AT BEDTIME Initial call taken by: Barb Merino,  July 29, 2009 9:55 AM    Prescriptions: PRAVACHOL 40 MG  TABS (PRAVASTATIN SODIUM) TAKE ONE TABLET AT BEDTIME  #30 Tablet x 0   Entered by:   Army Fossa CMA   Authorized by:   Loreen Freud DO   Signed by:   Army Fossa CMA on 07/29/2009   Method used:   Electronically to        [facility] [phone]* (retail)       [address]       [place], [place]  [address]       Ph: [phone]       Fax: [phone]   RxID:   [id number] LISINOPRIL 10 MG TABS (LISINOPRIL) TAKE ONE TABLET DAILY  #30 Tablet x 1   Entered by:   Army Fossa CMA   Authorized by:   Loreen Freud DO   Signed by:   Army Fossa CMA on 07/29/2009   Method used:   Electronically to        [facility] [phone]* (retail)       [address]       [place], [place]  [address]       Ph: [phone]       Fax: [phone]   RxID:   [id number]

## 2010-08-17 NOTE — Letter (Signed)
Summary: [facility]   Imported By: Lamona Curl CMA (AAMA) 08/04/2010 17:07:18  _____________________________________________________________________  External Attachment:    Type:   Image     Comment:   External Document

## 2010-08-17 NOTE — Miscellaneous (Signed)
Summary: Cipro Rx  Clinical Lists Changes  Medications: Added new medication of CIPRO 500 MG TABS (CIPROFLOXACIN HCL) Take 1 tablet by mouth two times a day x 7 days - Signed Rx of CIPRO 500 MG TABS (CIPROFLOXACIN HCL) Take 1 tablet by mouth two times a day x 7 days;  #14 x 0;  Signed;  Entered by: Lamona Curl CMA (AAMA);  Authorized by: Hart Carwin MD;  Method used: [facility] [phone]*, [address] [place], [place], [place]  [address], Ph: [phone], Fax: [phone]    Prescriptions: CIPRO 500 MG TABS (CIPROFLOXACIN HCL) Take 1 tablet by mouth two times a day x 7 days  #14 x 0   Entered by:   Lamona Curl CMA (AAMA)   Authorized by:   Hart Carwin MD   Signed by:   Lamona Curl CMA (AAMA) on 08/04/2010   Method used:   Electronically to        [facility] [phone]* (retail)       [address]       [place], [place]  [address]       Ph: [phone]       Fax: [phone]   RxID:   [id number]

## 2010-08-17 NOTE — Assessment & Plan Note (Signed)
Summary: follow up-lb   Vital Signs:  Patient profile:   60 year old female Height:      64.5 inches (163.83 cm) Weight:      252.50 pounds (114.77 kg) BMI:     42.83 O2 Sat:      97 % on Room air Temp:     98.2 degrees F (36.78 degrees C) oral Pulse rate:   79 / minute Pulse rhythm:   regular BP sitting:   118 / 70  (left arm) Cuff size:   large  Vitals Entered By: Brenton Grills CMA (AAMA) (July 20, 2010 2:09 PM)  O2 Flow:  Room air CC: Follow-up visit/refill on Actos/aj Is Patient Diabetic? Yes Comments Pt is no longer taking Azithromycin   Primary Provider:  Laury Axon  CC:  Follow-up visit/refill on Actos/aj.  History of Present Illness: no cbg record, but states cbg's are 300.   symptomatically,  she reports fatigue, itching, diarrhea, and light-colored bm's.    she has a few weeks of slight right flank pain, and assoc nausea.  Current Medications (verified): 1)  Nasacort Aq 55 Mcg/act Aers (Triamcinolone Acetonide(Nasal)) .... Two Sprays Each Nostril Daily 2)  Actos 45 Mg  Tabs (Pioglitazone Hcl) .... Take One Tablet Daily 3)  Glipizide 10 Mg Tb24 (Glipizide) .... Take One Tablet Twice Daily. 4)  Atenolol 50 Mg Tabs (Atenolol) .... Take One Tablet Daily 5)  Metformin Hcl 500 Mg Tabs (Metformin Hcl) .... Take Two Tablet Twice Daily- 6)  Cyclobenzaprine Hcl 10 Mg Tabs (Cyclobenzaprine Hcl) .... Take One Tablet 3 Times A Day As Needed 7)  Lisinopril 10 Mg Tabs (Lisinopril) .... Take One Tablet Daily- Office Viist and Labs Due For Further Refills 8)  Onetouch Ultra Test   Strp (Glucose Blood) .... Use As Directed 9)  Pravachol 40 Mg  Tabs (Pravastatin Sodium) .... Take One Tablet At Bedtime 10)  Maxalt 10 Mg Tabs (Rizatriptan Benzoate) .... Take As Directed As Needed 11)  Ultram 50 Mg Tabs (Tramadol Hcl) .Marland Kitchen.. 1-2 Every 6 Hours As Needed 12)  Promethazine Hcl 25 Mg Tabs (Promethazine Hcl) .Marland Kitchen.. 1 By Mouth Qid As Needed 13)  Onetouch Delica Lancets  Misc (Lancets) .... Acc  Checks Daily 14)  Vitamin D3 1000 Unit Caps (Cholecalciferol) .Marland Kitchen.. 1 By Mouth Daily. 15)  Azithromycin 500 Mg Tabs (Azithromycin) .Marland Kitchen.. 1 Once Daily 16)  Januvia 100 Mg Tabs (Sitagliptin Phosphate) .Marland Kitchen.. 1 Each Am 17)  Welchol 625 Mg Tabs (Colesevelam Hcl) .... 6 Once Daily 18)  Prilosec Otc 20 Mg Tbec (Omeprazole Magnesium) .... 2 Tablets By Mouth Once Daily 19)  Melatonin 5 Mg Tabs (Melatonin) .Marland Kitchen.. 1 Tablet By Mouth At Bedtime 20)  Multivitamins  Caps (Multiple Vitamin) .... 2 By Mouth Once Daily 21)  Menopause Support  Tabs (Specialty Vitamins Products) .Marland Kitchen.. 1 By Mouth Once Daily  Allergies (verified): No Known Drug Allergies  Past History:  Past Medical History: Last updated: 06/11/2007 Diabetes mellitus, type II Hypertension Depression fibromyalgia  Review of Systems  The patient denies hypoglycemia.         she reports dark urine  Physical Exam  General:  morbidly obese.  no distress Abdomen:  abdomen is soft.  there is slight right flank pain.  no hepatosplenomegaly.   not distended.  no hernia.  Pulses:  dorsalis pedis intact bilat.   Extremities:  no deformity.  no ulcer on the feet.  feet are of normal color and temp.   Neurologic:  sensation is intact  to touch on the feet  Additional Exam:  Fructosamine         [H]  343 umol/L   (converts to a1c of 7.5)  (i reviewed lab results from [facility])   Impression & Recommendations:  Problem # 1:  DIABETES MELLITUS, TYPE II (ICD-250.00) HgbA1C: 8.2 (07/05/2010) improved per a1c, worse per cbg report from patient.    Problem # 2:  FLANK PAIN, RIGHT (ICD-789.09) Assessment: New uncertain etiology  Medications Added to Medication List This Visit: 1)  Prilosec Otc 20 Mg Tbec (Omeprazole magnesium) .... 2 tablets by mouth once daily 2)  Melatonin 5 Mg Tabs (Melatonin) .Marland Kitchen.. 1 tablet by mouth at bedtime 3)  Multivitamins Caps (Multiple vitamin) .... 2 by mouth once daily 4)  Menopause Support Tabs (Specialty  vitamins products) .Marland Kitchen.. 1 by mouth once daily 5)  Omeprazole 40 Mg Cpdr (Omeprazole) .Marland Kitchen.. 1 tab once daily  Other Orders: T-Fructosamine (81191-47829) Radiology Referral (Radiology) TLB-Hepatic/Liver Function Pnl (80076-HEPATIC) TLB-CBC Platelet - w/Differential (85025-CBCD) TLB-Amylase (82150-AMYL) TLB-Udip w/ Micro (81001-URINE) TLB-A1C / Hgb A1C (Glycohemoglobin) (83036-A1C) TLB-BMP (Basic Metabolic Panel-BMET) (80048-METABOL) Est. Patient Level IV (56213)  Patient Instructions: 1)  check your blood sugar 1-2 times a day.  vary the time of day when you check, between before the 3 meals, and at bedtime.  also check if you have symptoms of your blood sugar being too high or too low.  please keep a record of the readings and bring it to your next appointment here.  please call us sooner if you are having low blood sugar episodes. 2)  Please schedule a follow-up appointment in 1 month. 3)  check abdominal ultrasound.  you will be called with a day and time for an appointment. 4)  blood tests are being ordered for you today.  please call [phone] to hear your test results. 5)  pending the test results, please continue the same medications for now 6)  (update: i left message on phone-tree:  insulin will be necessary to control dm.  call if you agree, and i'll ref you to dm educator to learn insulin injects). Prescriptions: ACTOS 45 MG  TABS (PIOGLITAZONE HCL) TAKE ONE TABLET DAILY  #30 Tablet x 11   Entered and Authorized by:   Minus Breeding MD   Signed by:   Minus Breeding MD on 07/20/2010   Method used:   [facility] to        [facility] [phone]* (retail)       [address]       [place], [place]  [address]       Ph: [phone]       Fax: [phone]   RxID:   [id number] OMEPRAZOLE 40 MG CPDR (OMEPRAZOLE) 1 tab once daily  #30 x 11   Entered and Authorized by:   Minus Breeding MD   Signed by:   Minus Breeding MD on 07/20/2010   Method used:    [facility] to        [facility] [phone]* (retail)       [address]       [place], [place]  [address]       Ph: [phone]       Fax: [phone]   RxID:   [id number]    Orders Added: 1)  T-Fructosamine [phone] 2)  Radiology Referral [Radiology] 3)  TLB-Hepatic/Liver Function Pnl [  80076-HEPATIC] 4)  TLB-CBC Platelet - w/Differential [85025-CBCD] 5)  TLB-Amylase [82150-AMYL] 6)  TLB-Udip w/ Micro [81001-URINE] 7)  TLB-A1C / Hgb A1C (Glycohemoglobin) [83036-A1C] 8)  TLB-BMP (Basic Metabolic Panel-BMET) [80048-METABOL] 9)  Est. Patient Level IV [04540]

## 2010-08-17 NOTE — Assessment & Plan Note (Signed)
Summary: ROA,Labwork/drb   Vital Signs:  Patient profile:   60 year old female Height:      64.5 inches Weight:      258.13 pounds BMI:     43.78 Temp:     97.7 degrees F oral Pulse rate:   82 / minute Pulse rhythm:   regular BP sitting:   136 / 76  (left arm) Cuff size:   large  Vitals Entered By: Army Fossa CMA (September 05, 2009 8:21 AM) CC: Follow up, labwork, refills.    History of Present Illness:  Type 1 diabetes mellitus follow-up      This is a 60 year old woman who presents with Type 2 diabetes mellitus follow-up.  Pt here with husband for f/u.  The patient denies polyuria, polydipsia, blurred vision, self managed hypoglycemia, hypoglycemia requiring help, weight loss, weight gain, and numbness of extremities.  Other symptoms include neuropathic pain.  The patient denies the following symptoms: chest pain, vomiting, orthostatic symptoms, poor wound healing, intermittent claudication, vision loss, and foot ulcer.  Since the last visit the patient reports poor dietary compliance, compliance with medications, not exercising regularly, and monitoring blood glucose.  Since the last visit, the patient reports having had no eye care and no foot care.  Complications from diabetes include peripheral neuropathy.    Hypertension follow-up      The patient also presents for Hypertension follow-up.  The patient denies lightheadedness, urinary frequency, headaches, edema, impotence, rash, and fatigue.  The patient denies the following associated symptoms: chest pain, chest pressure, dyspnea, palpitations, and syncope.  Compliance with medications (by patient report) has been near 100%.  The patient reports that dietary compliance has been poor.  The patient reports no exercise.  Adjunctive measures currently used by the patient include salt restriction.    Pt also c/o increased pain in entire back but mostly in mid back. Pt fell again several months again.  She was bending over in  bathroom and lost her balance and she hit the top of her head and "jammed " her spine.  Pain has been increased since then.   No LOC,  no loss of bowel or bladder control.  Pt with Hx Laminectomy about 15 years ago with Dr Clayton Lefort.     Allergies (verified): No Known Drug Allergies  Past History:  Past Medical History: Last updated: 06/11/2007 Diabetes mellitus, type II Hypertension Depression fibromyalgia  Past Surgical History: Last updated: 06/11/2007 Lumbar laminectomy mandibular advancement Tonsillectomy  Family History: Last updated: 06/11/2007 Family History Diabetes 1st degree relative F-- esophageal ca, etoh M-- mesothelioma, dialysis Family History of Alcoholism/Addiction Family History Breast cancer 1st degree relative 86 S Family History of Stroke M 1st degree relative <50 B pGF---  MI  Social History: Last updated: 06/11/2007 Married Former Smoker Alcohol use-no Drug use-no Regular exercise-no  Risk Factors: Caffeine Use: 0 (06/11/2007) Exercise: no (06/11/2007)  Risk Factors: Smoking Status: quit (06/11/2007) Passive Smoke Exposure: no (06/11/2007)  Family History: Reviewed history from 06/11/2007 and no changes required. Family History Diabetes 1st degree relative F-- esophageal ca, etoh M-- mesothelioma, dialysis Family History of Alcoholism/Addiction Family History Breast cancer 1st degree relative 26 S Family History of Stroke M 1st degree relative <50 B pGF---  MI  Social History: Reviewed history from 06/11/2007 and no changes required. Married Former Smoker Alcohol use-no Drug use-no Regular exercise-no  Review of Systems      See HPI  Physical Exam  General:  Well-developed,well-nourished,in no acute distress; alert,appropriate  and cooperative throughout examination Lungs:  Normal respiratory effort, chest expands symmetrically. Lungs are clear to auscultation, no crackles or wheezes. Heart:  Normal rate and regular rhythm.  S1 and S2 normal without gallop, murmur, click, rub or other extra sounds. Psych:  Oriented X3, depressed affect, flat affect, and subdued.    Diabetes Management Exam:    Foot Exam (with socks and/or shoes not present):       Sensory-Pinprick/Light touch:          Left medial foot (L-4): normal          Left dorsal foot (L-5): normal          Left lateral foot (S-1): normal          Right medial foot (L-4): normal          Right dorsal foot (L-5): normal          Right lateral foot (S-1): normal       Sensory-Monofilament:          Left foot: normal          Right foot: normal       Inspection:          Left foot: normal          Right foot: normal       Nails:          Left foot: normal          Right foot: normal   Impression & Recommendations:  Problem # 1:  BACK PAIN WITH RADICULOPATHY (ICD-729.2)  Orders: T-Cervicle Spine 2-3 Views (72040TC) T-Lumbar Spine 2 Views (72100TC) T-Thoracic Spine 2 Views (56213YQ)  Problem # 2:  FIBROMYALGIA (ICD-729.1)  The following medications were removed from the medication list:    Propoxyphene N-apap 100-650 Mg Tabs (Propoxyphene n-apap) .Marland Kitchen... 1 by mouth every 6 hours as needed Her updated medication list for this problem includes:    Cyclobenzaprine Hcl 10 Mg Tabs (Cyclobenzaprine hcl) .Marland Kitchen... Take one tablet 3 times a day as needed    Ultram 50 Mg Tabs (Tramadol hcl) .Marland Kitchen... 1-2 every 6 hours as needed  Orders: Venipuncture (65784) TLB-Lipid Panel (80061-LIPID) TLB-BMP (Basic Metabolic Panel-BMET) (80048-METABOL) TLB-CBC Platelet - w/Differential (85025-CBCD) TLB-Hepatic/Liver Function Pnl (80076-HEPATIC) TLB-TSH (Thyroid Stimulating Hormone) (84443-TSH) TLB-A1C / Hgb A1C (Glycohemoglobin) (83036-A1C) TLB-Microalbumin/Creat Ratio, Urine (82043-MALB) T-Vitamin D (25-Hydroxy) (69629-52841)  Problem # 3:  HYPERTENSION (ICD-401.9)  Her updated medication list for this problem includes:    Atenolol 50 Mg Tabs (Atenolol) .Marland Kitchen...  Take one tablet daily    Lisinopril 10 Mg Tabs (Lisinopril) .Marland Kitchen... Take one tablet daily  Orders: Venipuncture (32440) TLB-Lipid Panel (80061-LIPID) TLB-BMP (Basic Metabolic Panel-BMET) (80048-METABOL) TLB-CBC Platelet - w/Differential (85025-CBCD) TLB-Hepatic/Liver Function Pnl (80076-HEPATIC) TLB-TSH (Thyroid Stimulating Hormone) (84443-TSH) TLB-A1C / Hgb A1C (Glycohemoglobin) (83036-A1C) TLB-Microalbumin/Creat Ratio, Urine (82043-MALB)  Problem # 4:  DIABETES MELLITUS, TYPE II (ICD-250.00)  Her updated medication list for this problem includes:    Actos 45 Mg Tabs (Pioglitazone hcl) .Marland Kitchen... Take one tablet daily    Glipizide 10 Mg Tb24 (Glipizide) .Marland Kitchen... Take one tablet twice daily. needs office visit before additional refills.    Metformin Hcl 500 Mg Tabs (Metformin hcl) .Marland Kitchen... Take two tablet twice daily- needs to schedule office visit and labs before further refills    Lisinopril 10 Mg Tabs (Lisinopril) .Marland Kitchen... Take one tablet daily  Orders: Venipuncture ([address]) TLB-Lipid Panel (80061-LIPID) TLB-BMP (Basic Metabolic Panel-BMET) (80048-METABOL) TLB-CBC Platelet - w/Differential (85025-CBCD) TLB-Hepatic/Liver Function Pnl (80076-HEPATIC) TLB-TSH (Thyroid  Stimulating Hormone) (84443-TSH) TLB-A1C / Hgb A1C (Glycohemoglobin) (83036-A1C) TLB-Microalbumin/Creat Ratio, Urine (82043-MALB)  Problem # 5:  MIGRAINE HEADACHE (ICD-346.90)  The following medications were removed from the medication list:    Propoxyphene N-apap 100-650 Mg Tabs (Propoxyphene n-apap) .Marland Kitchen... 1 by mouth every 6 hours as needed Her updated medication list for this problem includes:    Atenolol 50 Mg Tabs (Atenolol) .Marland Kitchen... Take one tablet daily    Maxalt 10 Mg Tabs (Rizatriptan benzoate) .Marland Kitchen... Take as directed as needed    Ultram 50 Mg Tabs (Tramadol hcl) .Marland Kitchen... 1-2 every 6 hours as needed  Complete Medication List: 1)  Nasacort Aq 55 Mcg/act Aers (Triamcinolone acetonide(nasal)) .... Two sprays each nostril daily 2)   Actos 45 Mg Tabs (Pioglitazone hcl) .... Take one tablet daily 3)  Glipizide 10 Mg Tb24 (Glipizide) .... Take one tablet twice daily. needs office visit before additional refills. 4)  Atenolol 50 Mg Tabs (Atenolol) .... Take one tablet daily 5)  Metformin Hcl 500 Mg Tabs (Metformin hcl) .... Take two tablet twice daily- needs to schedule office visit and labs before further refills 6)  Cyclobenzaprine Hcl 10 Mg Tabs (Cyclobenzaprine hcl) .... Take one tablet 3 times a day as needed 7)  Lisinopril 10 Mg Tabs (Lisinopril) .... Take one tablet daily 8)  Onetouch Ultra Test Strp (Glucose blood) .... Use as directed 9)  Pravachol 40 Mg Tabs (Pravastatin sodium) .... Take one tablet at bedtime 10)  Maxalt 10 Mg Tabs (Rizatriptan benzoate) .... Take as directed as needed 11)  Ultram 50 Mg Tabs (Tramadol hcl) .Marland Kitchen.. 1-2 every 6 hours as needed 12)  Promethazine Hcl 25 Mg Tabs (Promethazine hcl) .Marland Kitchen.. 1 by mouth qid as needed 13)  Onetouch Delica Lancets Misc (Lancets) .... Acc checks daily Prescriptions: ONETOUCH DELICA LANCETS  MISC (LANCETS) acc checks daily  #1 box x 11   Entered and Authorized by:   Loreen Freud DO   Signed by:   Loreen Freud DO on 09/05/2009   Method used:   [facility] to        [facility] [phone]* (retail)       [address]       [place], [place]  [address]       Ph: [phone]       Fax: [phone]   RxID:   [id number] PROMETHAZINE HCL 25 MG TABS (PROMETHAZINE HCL) 1 by mouth qid as needed  #30 x 1   Entered and Authorized by:   Loreen Freud DO   Signed by:   Loreen Freud DO on 09/05/2009   Method used:   Electronically to        [facility] [phone]* (retail)       [address]       [place], [place]  [address]       Ph: [phone]       Fax: [phone]   RxID:   [id number] ULTRAM 50 MG TABS (TRAMADOL HCL) 1-2 every 6 hours as needed  #90 x 1   Entered and Authorized by:   Loreen Freud  DO   Signed by:   Loreen Freud DO on 09/05/2009   Method used:   Electronically to        [facility] [phone]* (retail)       [address] [facility] [address]       [place], [place]  [address]       Ph: [phone]       Fax: [phone]   RxID:   [id number] ACTOS 45 MG  TABS (PIOGLITAZONE HCL) TAKE ONE TABLET DAILY  #30 x 5   Entered and Authorized by:   Loreen Freud DO   Signed by:   Loreen Freud DO on 09/05/2009   Method used:   Electronically to        [facility] [phone]* (retail)       [address]       [place], [place]  [address]       Ph: [phone]       Fax: [phone]   RxID:   [id number] MAXALT 10 MG TABS (RIZATRIPTAN BENZOATE) TAKE AS DIRECTED as needed  #5 Tablet x 0   Entered and Authorized by:   Loreen Freud DO   Signed by:   Loreen Freud DO on 09/05/2009   Method used:   Electronically to        [facility] [phone]* (retail)       [address]       [place], [place]  [address]       Ph: [phone]       Fax: [phone]   RxID:   [id number] PRAVACHOL 40 MG  TABS (PRAVASTATIN SODIUM) TAKE ONE TABLET AT BEDTIME  #30 Tablet x 5   Entered and Authorized by:   Loreen Freud DO   Signed by:   Loreen Freud DO on 09/05/2009   Method used:   Electronically to        [facility] [phone]* (retail)       [address]       [place], [place]  [address]       Ph: [phone]       Fax: [phone]   RxID:   [id number] [place] TEST   STRP (GLUCOSE BLOOD) USE AS DIRECTED  #100 x 11   Entered and Authorized by:   Loreen Freud DO   Signed by:   Loreen Freud DO on 09/05/2009   Method used:   Electronically to        [facility] [phone]* (retail)       [address]       [place], [place]  [address]       Ph: [phone]       Fax: [phone]   RxID:   [id number] LISINOPRIL 10 MG TABS (LISINOPRIL)  TAKE ONE TABLET DAILY  #30 Tablet x 5   Entered and Authorized by:   Loreen Freud DO   Signed by:   Loreen Freud DO on 09/05/2009   Method used:   Electronically to        [facility] [phone]* (retail)       [address]       [place], [place]  [address]       Ph: [phone]       Fax: [phone]   RxID:   [id number] CYCLOBENZAPRINE HCL 10 MG TABS (CYCLOBENZAPRINE HCL) take one tablet 3 times a day as needed  #60 Tablet x 0   Entered and Authorized by:   Loreen Freud DO   Signed by:  Loreen Freud DO on 09/05/2009   Method used:   Electronically to        [facility] [phone]* (retail)       [address]       [place], [place]  [address]       Ph: [phone]       Fax: [phone]   RxID:   [id number] ATENOLOL 50 MG TABS (ATENOLOL) TAKE ONE TABLET DAILY  #30 Tablet x 5   Entered and Authorized by:   Loreen Freud DO   Signed by:   Loreen Freud DO on 09/05/2009   Method used:   Electronically to        [facility] [phone]* (retail)       [address]       [place], [place]  [address]       Ph: [phone]       Fax: [phone]   RxID:   [id number] GLIPIZIDE 10 MG TB24 (GLIPIZIDE) TAKE ONE TABLET TWICE DAILY. NEEDS OFFICE VISIT BEFORE ADDITIONAL REFILLS.  #60 x 5   Entered and Authorized by:   Loreen Freud DO   Signed by:   Loreen Freud DO on 09/05/2009   Method used:   Electronically to        [facility] [phone]* (retail)       [address]       [place], [place]  [address]       Ph: [phone]       Fax: [phone]   RxID:   [id number] NASACORT AQ 55 MCG/ACT AERS (TRIAMCINOLONE ACETONIDE(NASAL)) two sprays each nostril daily  #1 x 11   Entered and Authorized by:   Loreen Freud DO   Signed by:   Loreen Freud DO on 09/05/2009   Method used:   Electronically to        [facility] [phone]* (retail)       [address]       [place], [place]  [address]       Ph: [phone]       Fax: [phone]   RxID:   [id number]     Appended Document: ROA,Labwork/drb

## 2010-08-17 NOTE — Letter (Signed)
Summary: [facility]  [facility]   Imported By: Lester Barneveld 07/26/2010 12:35:51  _____________________________________________________________________  External Attachment:    Type:   Image     Comment:   External Document

## 2010-08-17 NOTE — Letter (Signed)
Summary: New Patient letter  [facility]  [address] [place], [place] [address]   Phone: [phone]  Fax: [phone]       07/06/2010 MRN: [id number]  [facility] [address] [place], [place]  [address]  Dear Ms. Wilensky,  Welcome to the Gastroenterology Division at [facility].    You are scheduled to see Dr.  Juanda Chance on 09-04-10 at 9:15a.m. on the 3rd floor at [facility], [address]  We ask that you try to arrive at our office 15 minutes prior to your appointment time to allow for check-in.  We would like you to complete the enclosed self-administered evaluation form prior to your visit and bring it with you on the day of your appointment.  We will review it with you.  Also, please bring a complete list of all your medications or, if you prefer, bring the medication bottles and we will list them.  Please bring your insurance card so that we may make a copy of it.  If your insurance requires a referral to see a specialist, please bring your referral form from your primary care physician.  Co-payments are due at the time of your visit and may be paid by cash, check or credit card.     Your office visit will consist of a consult with your physician (includes a physical exam), any laboratory testing he/she may order, scheduling of any necessary diagnostic testing (e.g. x-ray, ultrasound, CT-scan), and scheduling of a procedure (e.g. Endoscopy, Colonoscopy) if required.  Please allow enough time on your schedule to allow for any/all of these possibilities.    If you cannot keep your appointment, please call [phone] to cancel or reschedule prior to your appointment date.  This allows [place] the opportunity to schedule an appointment for another patient in need of care.  If you do not cancel or reschedule by 5 p.m. the business day prior to your appointment date, you will be charged a $50.00 late cancellation/no-show fee.    Thank you for choosing  [facility] for your medical needs.  We appreciate the opportunity to care for you.  Please visit [place] at our website  to learn more about our practice.                     Sincerely,                                                             The Gastroenterology Division

## 2010-08-17 NOTE — Progress Notes (Signed)
Summary: refill until appt with dr Everardo All  Phone Note Call from Patient   Caller: Patient Call For: Loreen Freud DO Summary of Call: patient has appt with dr Everardo All [phone]- she was told metformin wouldnt be refill she needs to keep appt with him  (she cancelled 2 appt with him ) she wants to know if she can have refill for metformin until appt  appt [id number] ---- [facility] [address] Initial call taken by: Okey Regal Spring,  Nov 17, 2009 4:35 PM  Follow-up for Phone Call        ok to refill 1 month only Follow-up by: Loreen Freud DO,  Nov 17, 2009 4:54 PM  Additional Follow-up for Phone Call Additional follow up Details #1::        left message meds were sent in. Army Fossa CMA  Nov 17, 2009 4:57 PM     Prescriptions: METFORMIN HCL 500 MG TABS (METFORMIN HCL) take two tablet twice daily- NEEDS TO SCHEDULE OFFICE VISIT AND LABS BEFORE FURTHER REFILLS  #60 x 0   Entered by:   Army Fossa CMA   Authorized by:   Loreen Freud DO   Signed by:   Army Fossa CMA on 11/17/2009   Method used:   [facility] to        [facility] [phone]* (retail)       [address]       [place], [place]  [address]       Ph: [phone]       Fax: [phone]   RxID:   [id number]

## 2010-08-17 NOTE — Progress Notes (Signed)
Summary: rx reprint  Phone Note Call from Patient   Caller: Spouse Summary of Call: pt husband walked in to office stating that rx was never sent to pharmacy and would prefer to have a rx to take to pharmacy...............Marland KitchenFelecia Deloach CMA  September 13, 2009 3:07 PM   rx reprinted and given to husband.............Marland KitchenFelecia Deloach CMA  September 13, 2009 3:08 PM     Prescriptions: METFORMIN HCL 500 MG TABS (METFORMIN HCL) take two tablet twice daily- NEEDS TO SCHEDULE OFFICE VISIT AND LABS BEFORE FURTHER REFILLS  #120 Tablet x 0   Entered by:   Jeremy Johann CMA   Authorized by:   Loreen Freud DO   Signed by:   Jeremy Johann CMA on 09/13/2009   Method used:   Reprint   RxID:   [id number]

## 2010-08-17 NOTE — Progress Notes (Signed)
Summary:  X-RAY RESULTS  Phone Note Outgoing Call   Summary of Call: tried to call pt no answer OR MACHINE will try again later...................Marland KitchenFelecia Deloach CMA  September 05, 2009 3:30 PM   X-RAY RESULTS: arthritic changes, scoliosis,extensive arthritic changes  Follow-up for Phone Call        Pt is aware. Will call if things do not improve.Army Fossa CMA  September 05, 2009 4:37 PM

## 2010-08-17 NOTE — Procedures (Signed)
Summary: ERCP  Patient: [name] Note: All result statuses are Final unless otherwise noted.  Tests: (1) ERCP (ERC)   ERC ERCP                  DONE     [facility]     [address] [place], [place]  [address]           ERCP PROCEDURE REPORT           PATIENT:  Haley Mullins, Haley Mullins  MR#:  [id number]     BIRTHDATE:  April 15, 1951  GENDER:  female     ENDOSCOPIST:  Rachael Fee, MD     PROCEDURE DATE:  08/08/2010     REFERRED:  Avel Peace, MD; Lina Sar, MD     PROCEDURE:  ERCP with sphincterotomy, ERCP with removal of stones     INDICATIONS:  suspected stone gallbladder stones, dilated CBD,     elevated liver tests     MEDICATIONS:  Fentanyl 150 mcg IV, Versed 10 mg IV, Benadryl 25 mg     IV, glucagon 0.5 mg IV, Cipro 400mg  IV     TOPICAL ANESTHETIC:  Cetacaine Spray           DESCRIPTION OF PROCEDURE:   After the risks benefits and     alternatives of the procedure were thoroughly explained, informed     consent was obtained.  The Pentax ERCP [id number] endoscope was     introduced through the mouth and advanced to the second portion of     the duodenum without detailed examination of the UGI tract. 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  A 44 Autotome over a     .035 hydrawire was used to cannulate the CBD and contrast was     injected. Cholangiogram showed 3-4 medium sized stones (mobile     filling defects) in CBD.  The cystic duct was patent and the     gallbladder partially opacified.  The CBD was slightly dilated (up     to 9mm). The periampullary diverticulum created a smooth, partial     narrowing of the bile duct distally.  An adequate biliary     sphincterotomy was performed and then the CBD was swept several     times with a biliary balloon. Several dark, somewhat soft stones     were delivered into the duodenum. There was no purulence.     Completion cholangiogram  showed no residual filling defects. The     main pancreatic duct was never cannulate nor injected with dye.     <<PROCEDUREIMAGES>>           Impression:     Choledocholithiasis, treated with biliary sphincterotomy and     ballon sweeping.  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           ______________________________     Rachael Fee, MD           n.     eSIGNED:   Rachael Fee at 08/08/2010 08:20 AM           Francesco Runner, [id number]  Note: An exclamation mark (!) indicates a result that was not dispersed into the flowsheet. Document Creation Date: 08/08/2010 8:21  AM _______________________________________________________________________  (1) Order result status: Final Collection or observation date-time: 08/08/2010 08:10 Requested date-time:  Receipt date-time:  Reported date-time:  Referring Physician:   Ordering Physician: Rob Bunting [phone]) Specimen Source:  Source: Launa Grill Order Number: [id number] Lab site:

## 2010-08-17 NOTE — Letter (Signed)
Summary: Primary Care Appointment Letter  [facility]  [address] [place], [place] [address]   Phone: [phone]  Fax: [phone]    08/09/2009 MRN: [id number]  [facility] [address] [place], [place]  [address]  Dear Ms. Gelber,   Your Primary Care Physician Loreen Freud DO has indicated that:    ___X____it is time to schedule an appointment.    _______you missed your appointment on______ and need to call and          reschedule.    _______you need to have lab work done.    _______you need to schedule an appointment discuss lab or test results.    _______you need to call to reschedule your appointment that is                       scheduled on _________.     Please call our office as soon as possible. Our phone number is [phone]          _________. Please press option 1. Our office is open 8a-12noon and 1p-5p, Monday through Friday.     Thank you,    [facility] Scheduler

## 2010-08-17 NOTE — Letter (Signed)
Summary: ERCP Instructions  [facility]  [address] [place], [place] [address]   Phone: [phone]  Fax: [phone]       Haley Mullins    02/05/1951    MRN: [id number]       Procedure Day /Date: Tuesday 08/08/10     Arrival Time: 10:45 am     Procedure Time: 11:45 am     Location of Procedure:                    _ x _ [facility] ( Outpatient Registration)  PREPARATION FOR ERCP    On 08/08/10 THE DAY OF THE PROCEDURE:  1.   No solid foods, milk or milk products are allowed after midnight the night before your procedure.  2.   Do not drink anything colored red or purple.  Avoid juices with pulp.  No orange juice.  3.  You may drink clear liquids until 7:45 am, which is 4 hours before your procedure.                                                                                                CLEAR LIQUIDS INCLUDE: Water Jello Ice Popsicles Tea (sugar ok, no milk/cream) Powdered fruit flavored drinks Coffee (sugar ok, no milk/cream) Gatorade Juice: apple, white grape, white cranberry  Lemonade Clear bullion, consomm, broth Carbonated beverages (any kind) Strained chicken noodle soup Hard Candy   MEDICATION INSTRUCTIONS  Unless otherwise instructed, you should take regular prescription medications with a small sip of water as early as possible the morning of your procedure.  Diabetic patients - see separate instructions.                 OTHER INSTRUCTIONS  You will need a responsible adult at least 62 years of age to accompany you and drive you home.   This person must remain in the waiting room during your procedure.  Wear loose fitting clothing that is easily removed.  Leave jewelry and other valuables at home.  However, you may wish to bring a book to read or an iPod/MP3 player to listen to music as you wait for your procedure to start.  Remove all body piercing jewelry and leave at home.  Total time from sign-in until  discharge is approximately 2-3 hours.  You should go home directly after your procedure and rest.  You can resume normal activities the day after your procedure.  The day of your procedure you should not:   Drive   Make legal decisions   Operate machinery   Drink alcohol   Return to work  You will receive specific instructions about eating, activities and medications before you leave.    The above instructions have been reviewed and explained to me by   _______________________    I fully understand and can verbalize these instructions _____________________________ Date _________

## 2010-08-17 NOTE — Letter (Signed)
Summary: Diabetic Instructions  [facility]  [address] [place], [place] [address]   Phone: [phone]  Fax: [phone]    Haley Mullins [birth year] MRN: [id number]   _ x _   ORAL DIABETIC MEDICATION INSTRUCTIONS         Actos, Glipizide, Metformin and Januvia  The day before your procedure:   Take your diabetic pill as you do normally  The day of your procedure:   Do not take your diabetic pill    We will check your blood sugar levels during the admission process and again in Recovery before discharging you home  ________________________________________________________________________

## 2010-08-17 NOTE — Letter (Signed)
Summary: Letter with Patient Concerns  Letter with Patient Concerns   Imported By: Lanelle Bal 12/14/2009 13:47:18  _____________________________________________________________________  External Attachment:    Type:   Image     Comment:   External Document

## 2010-08-17 NOTE — Progress Notes (Signed)
Summary: refill authorization request  Phone Note Refill Request Message from:  Fax from Pharmacy on August 09, 2009 9:31 AM  Refills Requested: Medication #1:  METFORMIN HCL 500 MG TABS take two tablet twice daily- NEEDS TO SCHEDULE OFFICE VISIT AND LABS BEFORE FURTHER REFILLS   Dosage confirmed as above?Dosage Confirmed Next Appointment Scheduled: 08/24/09 Initial call taken by: Michaelle Copas,  August 09, 2009 9:32 AM  Follow-up for Phone Call        Pharmacy? Army Fossa CMA  August 09, 2009 9:41 AM   [facility] [address]Michaelle Copas  August 09, 2009 10:55 AM     New/Updated Medications: METFORMIN HCL 500 MG TABS (METFORMIN HCL) take two tablet twice daily- NEEDS TO SCHEDULE OFFICE VISIT AND LABS BEFORE FURTHER REFILLS Prescriptions: METFORMIN HCL 500 MG TABS (METFORMIN HCL) take two tablet twice daily- NEEDS TO SCHEDULE OFFICE VISIT AND LABS BEFORE FURTHER REFILLS  #120 x 0   Entered by:   Army Fossa CMA   Authorized by:   Loreen Freud DO   Signed by:   Army Fossa CMA on 08/09/2009   Method used:   Electronically to        [facility] [phone]* (retail)       [address]       [place], [place]  [address]       Ph: [phone]       Fax: [phone]   RxID:   [id number]

## 2010-08-17 NOTE — Progress Notes (Signed)
Summary: SURGICAL REFERRAL  Phone Note Call from Patient   Caller: Spouse Call For: Haley Mullins Summary of Call: Pt husband dropped off letter.  Pt is having significant pain, nausea etc---symptoms have worsened since appointment here.  Dr Everardo All ordered a [place] because her symptoms were worse----+ Gallstones.   Dr Everardo All is ordering HiDA scan..  pt husband is concerned because her symptoms are so bad.  he is requesting an earlier appointment.   Initial call taken by: Haley Mullins,  July 28, 2010 4:05 PM  Follow-up for Phone Call        If symptoms are worse and there are gallstones on Korea---- we will get her in with surgeon.  Pt husband aware we are making appointment with surgeon.  Follow-up by: Haley Mullins,  July 28, 2010 4:06 PM  Additional Follow-up for Phone Call Additional follow up Details #1::        I s/w patient's spouse this AM, patient did fine over the weekend.  I have patient scheduled to be seen at [facility] today, 07-31-2010 @ 3:45pm with Haley Mullins & spouse is aware. Haley Mullins Jackson  July 31, 2010 8:55 AM   New Problems: GALLSTONES (ICD-574.20)   New Problems: GALLSTONES (ICD-574.20)

## 2010-08-17 NOTE — Progress Notes (Signed)
Summary: refill  Phone Note Refill Request Message from:  Fax from Pharmacy on [address] fax [phone]  Refills Requested: Medication #1:  GLIPIZIDE 10 MG TB24 TAKE ONE TABLET TWICE DAILY Initial call taken by: Barb Merino,  July 26, 2009 11:01 AM    Prescriptions: GLIPIZIDE 10 MG TB24 (GLIPIZIDE) TAKE ONE TABLET TWICE DAILY  #60 Tablet x 0   Entered by:   Army Fossa CMA   Authorized by:   Loreen Freud DO   Signed by:   Army Fossa CMA on 07/26/2009   Method used:   Electronically to        [facility] [phone]* (retail)       [address]       [place], [place]  [address]       Ph: [phone]       Fax: [phone]   RxID:   [id number]

## 2010-08-17 NOTE — Progress Notes (Signed)
Summary: refill  Phone Note Refill Request Message from:  Fax from Pharmacy on [address] fax [phone]  Refills Requested: Medication #1:  GLIPIZIDE 10 MG TB24 TAKE ONE TABLET TWICE DAILY Initial call taken by: Barb Merino,  August 26, 2009 8:46 AM    New/Updated Medications: GLIPIZIDE 10 MG TB24 (GLIPIZIDE) TAKE ONE TABLET TWICE DAILY. NEEDS OFFICE VISIT BEFORE ADDITIONAL REFILLS. Prescriptions: GLIPIZIDE 10 MG TB24 (GLIPIZIDE) TAKE ONE TABLET TWICE DAILY. NEEDS OFFICE VISIT BEFORE ADDITIONAL REFILLS.  #60 x 0   Entered by:   Army Fossa CMA   Authorized by:   Loreen Freud DO   Signed by:   Army Fossa CMA on 08/26/2009   Method used:   Electronically to        [facility] [phone]* (retail)       [address]       [place], [place]  [address]       Ph: [phone]       Fax: [phone]   RxID:   [id number]

## 2010-08-23 NOTE — Procedures (Signed)
Summary: Instructions for procedure/[place]  Instructions for procedure/[place]   Imported By: Sherian Rein 08/14/2010 08:21:22  _____________________________________________________________________  External Attachment:    Type:   Image     Comment:   External Document

## 2010-08-31 NOTE — Letter (Signed)
Summary: [facility]   Imported By: Maryln Gottron 08/25/2010 09:11:46  _____________________________________________________________________  External Attachment:    Type:   Image     Comment:   External Document

## 2010-09-04 ENCOUNTER — Ambulatory Visit: Payer: Self-pay | Admitting: Internal Medicine

## 2010-09-05 ENCOUNTER — Other Ambulatory Visit (HOSPITAL_COMMUNITY): Payer: PRIVATE HEALTH INSURANCE

## 2010-09-08 ENCOUNTER — Ambulatory Visit (HOSPITAL_COMMUNITY)
Admission: RE | Admit: 2010-09-08 | Payer: PRIVATE HEALTH INSURANCE | Source: Home / Self Care | Admitting: General Surgery

## 2010-09-11 ENCOUNTER — Ambulatory Visit: Payer: PRIVATE HEALTH INSURANCE | Admitting: Internal Medicine

## 2010-10-03 ENCOUNTER — Other Ambulatory Visit: Payer: Self-pay | Admitting: Family Medicine

## 2010-10-04 NOTE — Telephone Encounter (Signed)
Removed from med list 12/25/08 last seen 10/04/10.Marland KitchenMarland Kitchenplease advise

## 2010-10-04 NOTE — Telephone Encounter (Signed)
Removed from med list 12/25/08 last seen 10/04/10.Marland KitchenMarland Kitchenplease advise -Sorry if you got this twice, I couldn't tell if it went through.

## 2010-10-05 NOTE — Telephone Encounter (Signed)
RX sent by Dr.Lowne.... KP

## 2010-10-08 ENCOUNTER — Other Ambulatory Visit: Payer: Self-pay | Admitting: Family Medicine

## 2010-10-27 ENCOUNTER — Other Ambulatory Visit: Payer: Self-pay | Admitting: Family Medicine

## 2010-11-25 ENCOUNTER — Other Ambulatory Visit: Payer: Self-pay | Admitting: Family Medicine

## 2010-11-28 ENCOUNTER — Other Ambulatory Visit: Payer: Self-pay | Admitting: Family Medicine

## 2010-11-29 MED ORDER — TRAMADOL HCL 50 MG PO TABS: ORAL_TABLET | ORAL | Status: DC

## 2010-11-29 NOTE — Telephone Encounter (Signed)
Rx resent to pharmacy as per Rx on 11-28-10

## 2010-11-29 NOTE — Telephone Encounter (Signed)
Last seen 07/05/10 and filled 08/09/10 #90 please advise     KP

## 2010-12-01 ENCOUNTER — Other Ambulatory Visit: Payer: Self-pay | Admitting: Family Medicine

## 2010-12-01 MED ORDER — LISINOPRIL 10 MG PO TABS: 10.0000 mg | ORAL_TABLET | Freq: Every day | ORAL | Status: DC

## 2010-12-01 NOTE — Telephone Encounter (Signed)
Per Cent[facility] pt appears to be due for physical and labs. Left message on voicemail to call the office

## 2010-12-01 NOTE — Telephone Encounter (Signed)
Spouse called to report that [facility] [address] middle of week for Lisinopril and they have not heard back from us--please call prescription to [facility]

## 2010-12-12 ENCOUNTER — Other Ambulatory Visit: Payer: Self-pay | Admitting: Family Medicine

## 2010-12-12 MED ORDER — PRAVASTATIN SODIUM 40 MG PO TABS: 40.0000 mg | ORAL_TABLET | Freq: Every day | ORAL | Status: DC

## 2010-12-12 NOTE — Telephone Encounter (Signed)
DONE

## 2010-12-18 ENCOUNTER — Ambulatory Visit: Payer: PRIVATE HEALTH INSURANCE | Admitting: Family Medicine

## 2010-12-21 ENCOUNTER — Ambulatory Visit (INDEPENDENT_AMBULATORY_CARE_PROVIDER_SITE_OTHER): Payer: PRIVATE HEALTH INSURANCE | Admitting: Family Medicine

## 2010-12-21 ENCOUNTER — Encounter: Payer: Self-pay | Admitting: Family Medicine

## 2010-12-21 DIAGNOSIS — G8929 Other chronic pain: Secondary | ICD-10-CM

## 2010-12-21 NOTE — Progress Notes (Signed)
  Subjective:    Patient ID: Haley Mullins, female    DOB: [birth year], 60 y.o.   MRN: [id number]  HPI Pt cancelled < 24 hours before appointment   Review of Systems     Objective:   Physical Exam        Assessment & Plan:

## 2010-12-23 ENCOUNTER — Encounter: Payer: Self-pay | Admitting: Family Medicine

## 2010-12-24 ENCOUNTER — Other Ambulatory Visit: Payer: Self-pay | Admitting: Endocrinology

## 2010-12-25 ENCOUNTER — Other Ambulatory Visit: Payer: Self-pay | Admitting: Family Medicine

## 2010-12-28 ENCOUNTER — Telehealth: Payer: Self-pay | Admitting: Family Medicine

## 2010-12-28 ENCOUNTER — Ambulatory Visit: Payer: PRIVATE HEALTH INSURANCE | Admitting: Family Medicine

## 2010-12-28 MED ORDER — LISINOPRIL 10 MG PO TABS: 10.0000 mg | ORAL_TABLET | Freq: Every day | ORAL | Status: DC

## 2010-12-28 NOTE — Telephone Encounter (Signed)
Pt husband came in wanting a refill on lisinopril 10 mg to [facility] [address].

## 2010-12-28 NOTE — Telephone Encounter (Signed)
1/2 supply sent       KP

## 2010-12-29 ENCOUNTER — Encounter: Payer: Self-pay | Admitting: Family Medicine

## 2011-01-02 ENCOUNTER — Ambulatory Visit (INDEPENDENT_AMBULATORY_CARE_PROVIDER_SITE_OTHER): Payer: PRIVATE HEALTH INSURANCE | Admitting: Family Medicine

## 2011-01-02 ENCOUNTER — Encounter: Payer: Self-pay | Admitting: Family Medicine

## 2011-01-02 DIAGNOSIS — F3289 Other specified depressive episodes: Secondary | ICD-10-CM

## 2011-01-02 DIAGNOSIS — M542 Cervicalgia: Secondary | ICD-10-CM

## 2011-01-02 DIAGNOSIS — E785 Hyperlipidemia, unspecified: Secondary | ICD-10-CM

## 2011-01-02 DIAGNOSIS — IMO0001 Reserved for inherently not codable concepts without codable children: Secondary | ICD-10-CM

## 2011-01-02 DIAGNOSIS — I10 Essential (primary) hypertension: Secondary | ICD-10-CM

## 2011-01-02 DIAGNOSIS — IMO0002 Reserved for concepts with insufficient information to code with codable children: Secondary | ICD-10-CM

## 2011-01-02 DIAGNOSIS — G43909 Migraine, unspecified, not intractable, without status migrainosus: Secondary | ICD-10-CM

## 2011-01-02 DIAGNOSIS — K219 Gastro-esophageal reflux disease without esophagitis: Secondary | ICD-10-CM

## 2011-01-02 DIAGNOSIS — F329 Major depressive disorder, single episode, unspecified: Secondary | ICD-10-CM

## 2011-01-02 DIAGNOSIS — R5383 Other fatigue: Secondary | ICD-10-CM

## 2011-01-02 DIAGNOSIS — M549 Dorsalgia, unspecified: Secondary | ICD-10-CM

## 2011-01-02 DIAGNOSIS — G8929 Other chronic pain: Secondary | ICD-10-CM

## 2011-01-02 DIAGNOSIS — K8050 Calculus of bile duct without cholangitis or cholecystitis without obstruction: Secondary | ICD-10-CM

## 2011-01-02 DIAGNOSIS — R5381 Other malaise: Secondary | ICD-10-CM

## 2011-01-02 MED ORDER — ATENOLOL 50 MG PO TABS: 50.0000 mg | ORAL_TABLET | Freq: Every day | ORAL | Status: DC

## 2011-01-02 MED ORDER — RIZATRIPTAN BENZOATE 10 MG PO TABS: 10.0000 mg | ORAL_TABLET | ORAL | Status: DC | PRN

## 2011-01-02 MED ORDER — TRAMADOL HCL 50 MG PO TABS: ORAL_TABLET | ORAL | Status: AC

## 2011-01-02 MED ORDER — COLESEVELAM HCL 625 MG PO TABS: 1875.0000 mg | ORAL_TABLET | Freq: Two times a day (BID) | ORAL | Status: DC

## 2011-01-02 MED ORDER — PRAVASTATIN SODIUM 40 MG PO TABS: 40.0000 mg | ORAL_TABLET | Freq: Every day | ORAL | Status: DC

## 2011-01-02 MED ORDER — OMEPRAZOLE MAGNESIUM 20 MG PO TBEC: 20.0000 mg | DELAYED_RELEASE_TABLET | Freq: Every day | ORAL | Status: AC

## 2011-01-02 MED ORDER — LISINOPRIL 10 MG PO TABS: 10.0000 mg | ORAL_TABLET | Freq: Every day | ORAL | Status: DC

## 2011-01-02 NOTE — Assessment & Plan Note (Signed)
Pt has seen N/S in past She is requesting to go back to them

## 2011-01-02 NOTE — Progress Notes (Signed)
  Subjective:    Patient ID: Haley Mullins, female    DOB: [birth year], 60 y.o.   MRN: [id number]  HPI Pt here for f/u and refills.  She would like to f/u surgeon about GB.  She can call and make her own appointment.  She would also like to f/u with nudleman as well and she will call him also.  Pt is aware she may need a new MRI.  Review of Systems    as above Objective:   Physical Exam  Constitutional: She appears well-developed and well-nourished.  Psychiatric: Her behavior is normal. Judgment and thought content normal. Her affect is blunt.          Assessment & Plan:

## 2011-01-02 NOTE — Assessment & Plan Note (Signed)
Cont meds Check labs 

## 2011-01-02 NOTE — Assessment & Plan Note (Signed)
Face to face 30 min

## 2011-01-02 NOTE — Assessment & Plan Note (Signed)
F/u surgeon  

## 2011-01-02 NOTE — Assessment & Plan Note (Signed)
PT  F/u N/S

## 2011-01-02 NOTE — Patient Instructions (Signed)
Call Dr Mearl Latin  Office for appointment vs MRI F/u with Dr Everardo All for appointment

## 2011-01-02 NOTE — Assessment & Plan Note (Signed)
con't meds Refer PT

## 2011-01-04 ENCOUNTER — Other Ambulatory Visit: Payer: Self-pay | Admitting: Endocrinology

## 2011-01-04 ENCOUNTER — Other Ambulatory Visit: Payer: Self-pay | Admitting: Family Medicine

## 2011-01-05 ENCOUNTER — Telehealth: Payer: Self-pay | Admitting: *Deleted

## 2011-01-05 NOTE — Telephone Encounter (Signed)
Left message to call office to advise Pt of change in coverage and change in med.

## 2011-01-09 ENCOUNTER — Other Ambulatory Visit: Payer: Self-pay | Admitting: Endocrinology

## 2011-01-09 ENCOUNTER — Other Ambulatory Visit: Payer: Self-pay | Admitting: Family Medicine

## 2011-01-09 DIAGNOSIS — I10 Essential (primary) hypertension: Secondary | ICD-10-CM

## 2011-01-09 MED ORDER — SUMATRIPTAN SUCCINATE 100 MG PO TABS: 100.0000 mg | ORAL_TABLET | Freq: Once | ORAL | Status: AC | PRN

## 2011-01-09 NOTE — Telephone Encounter (Signed)
Rx faxed.    KP 

## 2011-01-09 NOTE — Telephone Encounter (Signed)
Pt spouse notified that pt must switch to step 1 drug of preferred product. He is aware that we will get permission from Dr. Laury Axon and then send rx directly to pharmacy. He will check with pharmacy in a couple of hours to see if rx is ready.  Will this rx be the same as Maxalt (#10 with 1 refill)? Please advise.

## 2011-01-09 NOTE — Telephone Encounter (Signed)
immitrex  100mg   #8  As directed .  No refills

## 2011-01-09 NOTE — Telephone Encounter (Signed)
Rx sent on 01/02/11 by Dr.Lowne---confirmed with pharmacy     KP

## 2011-01-11 ENCOUNTER — Other Ambulatory Visit: Payer: Self-pay | Admitting: Family Medicine

## 2011-01-11 NOTE — Telephone Encounter (Signed)
Patient husband said pharmacy didn't receive refill for lisininopril - [facility] [address] - he wants to pick up tonite

## 2011-01-11 NOTE — Telephone Encounter (Signed)
Called pharmacy and they rcv'd Rx on the 19 th but it was too soon to fill, they are ok to fill it today... Husband made aware    KP

## 2011-01-12 ENCOUNTER — Other Ambulatory Visit: Payer: Self-pay | Admitting: Family Medicine

## 2011-01-12 NOTE — Telephone Encounter (Signed)
Last seen 01/02/11 and filled 10/03/10 # 60 pease advise    KP

## 2011-02-27 ENCOUNTER — Other Ambulatory Visit: Payer: Self-pay | Admitting: Family Medicine

## 2011-02-28 NOTE — Telephone Encounter (Signed)
Rx sent 

## 2011-03-03 ENCOUNTER — Other Ambulatory Visit: Payer: Self-pay | Admitting: Family Medicine

## 2011-03-05 NOTE — Telephone Encounter (Signed)
Rx was prescribed/filled 02/27/11 #60 with 1 refill by Dr.John--- Last Hgb A1c was 1/12.

## 2011-04-28 ENCOUNTER — Other Ambulatory Visit: Payer: Self-pay | Admitting: Endocrinology

## 2011-04-30 ENCOUNTER — Other Ambulatory Visit: Payer: Self-pay | Admitting: Family Medicine

## 2011-04-30 DIAGNOSIS — I10 Essential (primary) hypertension: Secondary | ICD-10-CM

## 2011-04-30 MED ORDER — ATENOLOL 50 MG PO TABS: 50.0000 mg | ORAL_TABLET | Freq: Every day | ORAL | Status: DC

## 2011-04-30 NOTE — Telephone Encounter (Signed)
Rf sent

## 2011-05-01 ENCOUNTER — Other Ambulatory Visit: Payer: Self-pay | Admitting: Internal Medicine

## 2011-05-02 NOTE — Telephone Encounter (Signed)
Please refill x 1 Ov is due  

## 2011-05-02 NOTE — Telephone Encounter (Signed)
Rx sent to pharmacy, pt informed of MD's advisement for F/U OV, pt states she will callback office to schedule F/U OV.

## 2011-05-02 NOTE — Telephone Encounter (Signed)
Patient is seeing Dr.Ellison--please advise     KP

## 2011-05-16 ENCOUNTER — Other Ambulatory Visit: Payer: Self-pay | Admitting: Endocrinology

## 2011-06-04 ENCOUNTER — Other Ambulatory Visit: Payer: Self-pay | Admitting: Family Medicine

## 2011-06-05 NOTE — Telephone Encounter (Signed)
Last seen 01/02/11 and filled 01/12/11 # 60 please advise    KP

## 2011-06-14 ENCOUNTER — Telehealth: Payer: Self-pay | Admitting: Family Medicine

## 2011-06-14 NOTE — Telephone Encounter (Signed)
Jury duty is Jan 10,2013--will forward to Dr Laury Axon.   VM left making patient aware    KP

## 2011-06-17 NOTE — Telephone Encounter (Signed)
We need the letter she received----ok to do letter

## 2011-06-18 NOTE — Telephone Encounter (Signed)
Letter complete and VM left to make the husband aware to pick up   KP

## 2011-06-28 ENCOUNTER — Other Ambulatory Visit: Payer: Self-pay | Admitting: Endocrinology

## 2011-06-29 ENCOUNTER — Other Ambulatory Visit: Payer: Self-pay | Admitting: Endocrinology

## 2011-07-19 ENCOUNTER — Other Ambulatory Visit: Payer: Self-pay | Admitting: Endocrinology

## 2011-08-26 ENCOUNTER — Other Ambulatory Visit: Payer: Self-pay | Admitting: Endocrinology

## 2011-09-04 ENCOUNTER — Other Ambulatory Visit: Payer: Self-pay | Admitting: Family Medicine

## 2011-09-11 ENCOUNTER — Other Ambulatory Visit: Payer: Self-pay | Admitting: Family Medicine

## 2011-09-14 ENCOUNTER — Other Ambulatory Visit: Payer: Self-pay | Admitting: Endocrinology

## 2011-09-23 ENCOUNTER — Other Ambulatory Visit: Payer: Self-pay | Admitting: Endocrinology

## 2011-09-29 ENCOUNTER — Other Ambulatory Visit: Payer: Self-pay | Admitting: Endocrinology

## 2011-10-09 ENCOUNTER — Other Ambulatory Visit: Payer: Self-pay | Admitting: Endocrinology

## 2011-10-10 ENCOUNTER — Other Ambulatory Visit: Payer: Self-pay | Admitting: Family Medicine

## 2011-10-10 MED ORDER — ATENOLOL 50 MG PO TABS: 50.0000 mg | ORAL_TABLET | Freq: Every day | ORAL | Status: DC

## 2011-10-10 NOTE — Telephone Encounter (Signed)
Refill for Atenolol 50MG  Tablet  Qty 30 Take 1-tablet by mouth daily  Last filled 2.19.13  FYI see notes below from last refill 2.19  atenolol (TENORMIN) 50 MG tablet  30 tablet  0  09/04/2011     Sig: TAKE 1 TABLET (50 MG TOTAL) BY MOUTH DAILY.   Class: Normal   DAW: No   Comment: Office visit due now   Authorizing Provider: Lelon Perla, DO   Ordering User: Arnette Norris, CMA

## 2011-10-10 NOTE — Telephone Encounter (Signed)
Letter mailed to schedule an apt.    KP 

## 2011-10-14 ENCOUNTER — Other Ambulatory Visit: Payer: Self-pay | Admitting: Endocrinology

## 2011-10-16 ENCOUNTER — Other Ambulatory Visit: Payer: Self-pay | Admitting: Endocrinology

## 2011-11-11 ENCOUNTER — Other Ambulatory Visit: Payer: Self-pay | Admitting: Endocrinology

## 2011-11-11 ENCOUNTER — Other Ambulatory Visit: Payer: Self-pay | Admitting: Family Medicine

## 2011-11-25 ENCOUNTER — Other Ambulatory Visit: Payer: Self-pay | Admitting: Endocrinology

## 2011-11-29 ENCOUNTER — Other Ambulatory Visit: Payer: Self-pay | Admitting: Endocrinology

## 2011-11-30 ENCOUNTER — Other Ambulatory Visit: Payer: Self-pay | Admitting: *Deleted

## 2011-11-30 MED ORDER — GLIPIZIDE ER 10 MG PO TB24: ORAL_TABLET | ORAL | Status: DC

## 2011-11-30 MED ORDER — METFORMIN HCL 500 MG PO TABS: ORAL_TABLET | ORAL | Status: DC

## 2011-11-30 NOTE — Telephone Encounter (Signed)
Pt's spouse called stating that pt has made a follow up appointment with Dr. Everardo All and needs rx for Metformin and Glipizide before appointment.

## 2011-12-06 ENCOUNTER — Ambulatory Visit: Payer: PRIVATE HEALTH INSURANCE | Admitting: Endocrinology

## 2011-12-11 ENCOUNTER — Ambulatory Visit: Payer: PRIVATE HEALTH INSURANCE | Admitting: Endocrinology

## 2011-12-11 ENCOUNTER — Other Ambulatory Visit: Payer: Self-pay | Admitting: Family Medicine

## 2011-12-11 DIAGNOSIS — Z0289 Encounter for other administrative examinations: Secondary | ICD-10-CM

## 2011-12-12 ENCOUNTER — Other Ambulatory Visit: Payer: Self-pay | Admitting: Endocrinology

## 2011-12-15 ENCOUNTER — Other Ambulatory Visit: Payer: Self-pay | Admitting: Endocrinology

## 2011-12-17 ENCOUNTER — Telehealth: Payer: Self-pay | Admitting: Endocrinology

## 2011-12-17 MED ORDER — PIOGLITAZONE HCL 45 MG PO TABS: 45.0000 mg | ORAL_TABLET | Freq: Every day | ORAL | Status: DC

## 2011-12-17 NOTE — Telephone Encounter (Signed)
The pt is hoping to get a refill of Actos (generic).  She has made an appt for next week.

## 2011-12-19 ENCOUNTER — Encounter: Payer: Self-pay | Admitting: *Deleted

## 2011-12-25 ENCOUNTER — Ambulatory Visit: Payer: PRIVATE HEALTH INSURANCE | Admitting: Endocrinology

## 2011-12-27 ENCOUNTER — Other Ambulatory Visit: Payer: Self-pay | Admitting: Endocrinology

## 2011-12-28 ENCOUNTER — Ambulatory Visit: Payer: PRIVATE HEALTH INSURANCE | Admitting: Endocrinology

## 2012-01-03 ENCOUNTER — Other Ambulatory Visit: Payer: Self-pay | Admitting: Family Medicine

## 2012-01-03 DIAGNOSIS — I10 Essential (primary) hypertension: Secondary | ICD-10-CM

## 2012-01-03 DIAGNOSIS — E785 Hyperlipidemia, unspecified: Secondary | ICD-10-CM

## 2012-01-03 MED ORDER — PRAVASTATIN SODIUM 40 MG PO TABS: 40.0000 mg | ORAL_TABLET | Freq: Every day | ORAL | Status: AC

## 2012-01-03 MED ORDER — LISINOPRIL 10 MG PO TABS: 10.0000 mg | ORAL_TABLET | Freq: Every day | ORAL | Status: AC

## 2012-01-03 NOTE — Telephone Encounter (Signed)
Refills x 2 last ov 6.19.12-NO Current appt on file, last refill 5.28.13 OV DUE NOW, letter mailed 3.27.13  1-pravastatin sodium 40 mg tab, qty 90, take one tablet by mouth daily , last fill 5.21.13  2-lisinopril 10 mg tablet, qty 90, take one tablet by mouth every day, last fill 5.21.13

## 2012-01-09 ENCOUNTER — Ambulatory Visit: Payer: PRIVATE HEALTH INSURANCE | Admitting: Endocrinology

## 2012-01-09 DIAGNOSIS — Z0289 Encounter for other administrative examinations: Secondary | ICD-10-CM

## 2012-01-10 ENCOUNTER — Other Ambulatory Visit: Payer: Self-pay | Admitting: Family Medicine

## 2012-01-10 ENCOUNTER — Other Ambulatory Visit: Payer: Self-pay | Admitting: Endocrinology

## 2012-01-13 ENCOUNTER — Other Ambulatory Visit: Payer: Self-pay | Admitting: Endocrinology

## 2012-01-16 ENCOUNTER — Encounter: Payer: PRIVATE HEALTH INSURANCE | Admitting: Family Medicine

## 2012-01-21 IMAGING — CR DG CERVICAL SPINE 2 OR 3 VIEWS
3 series · 3 of 3 positions shown · non-contrast
Comparison: 02/14/2005 MRI of the cervical spine.

CLINICAL DATA: Chronic back and neck pain.

CERVICAL SPINE - 2-3 VIEW

[w c-spine lat]
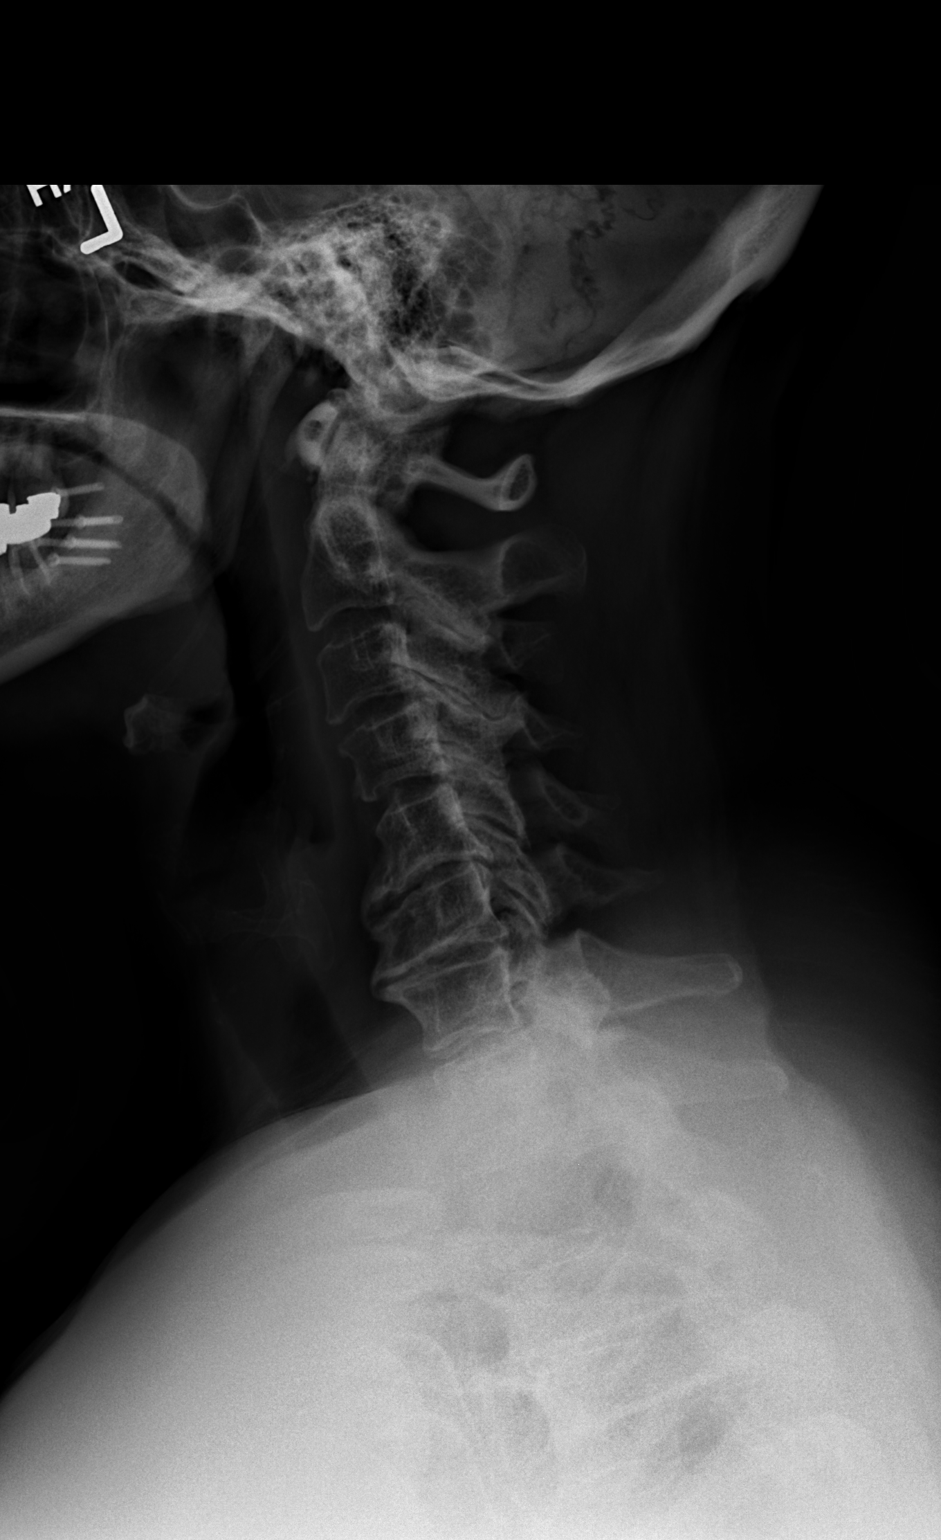

[w c-spine a.p.]
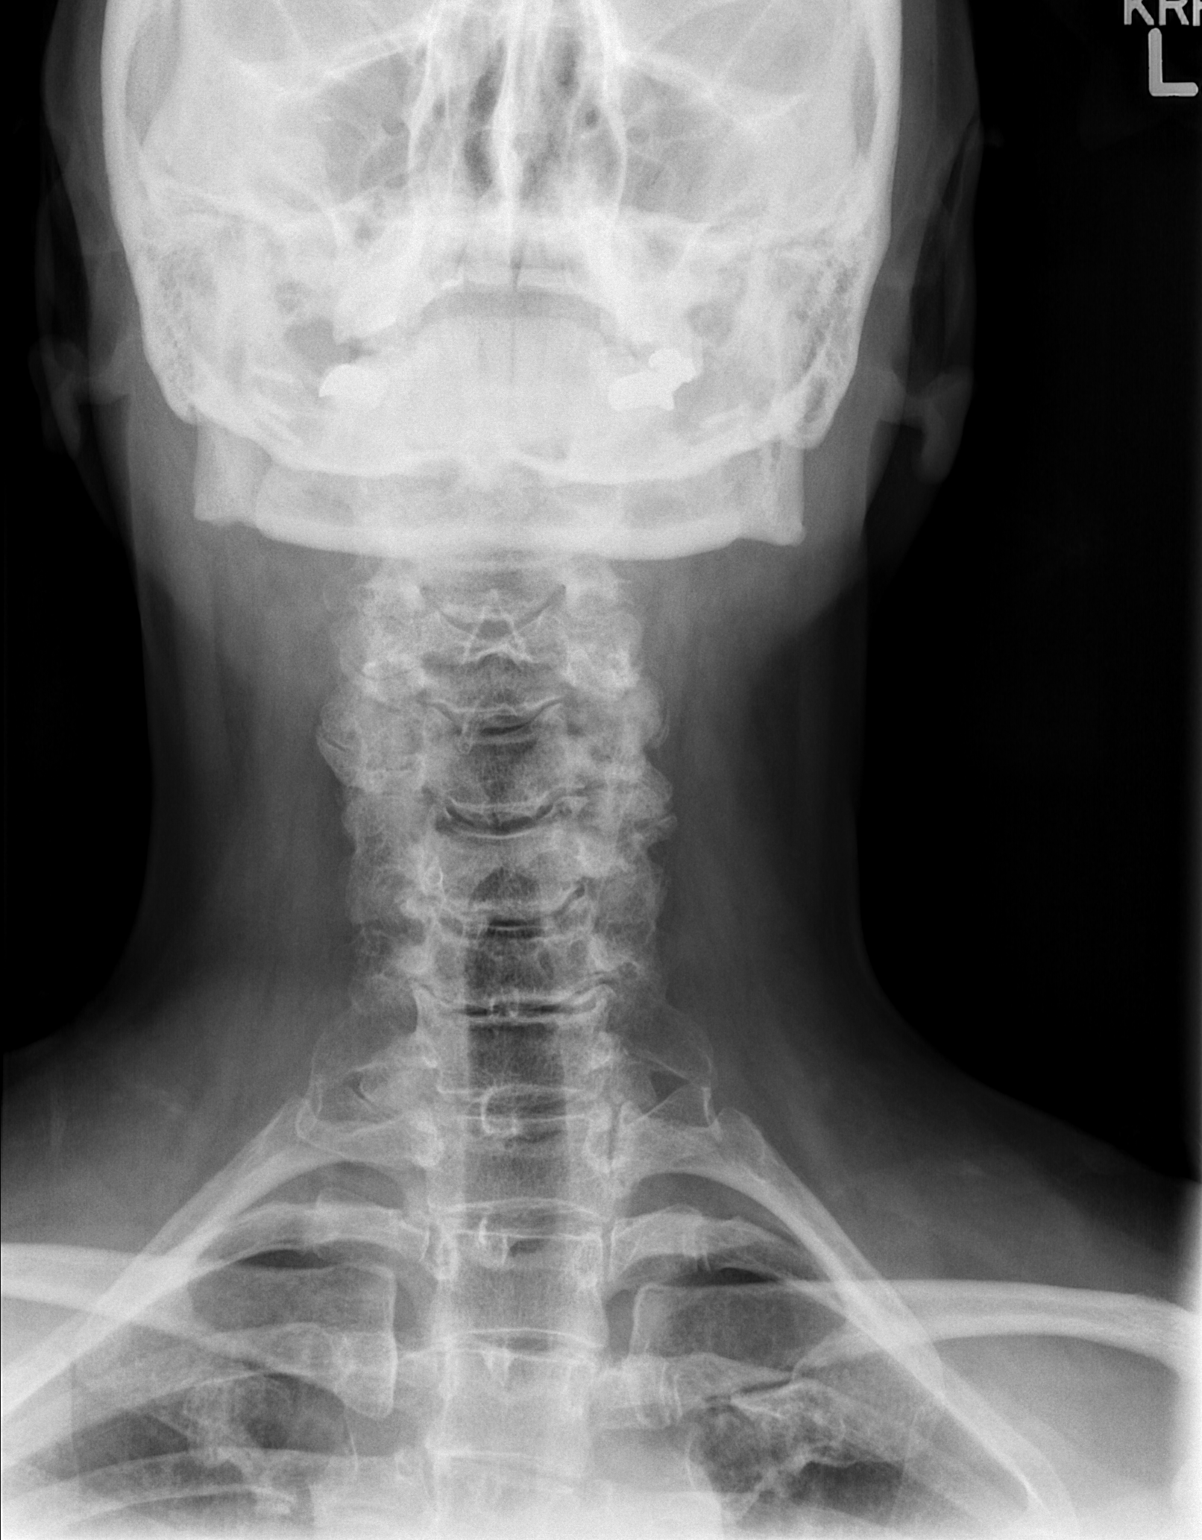

[w c-spine odontoid]
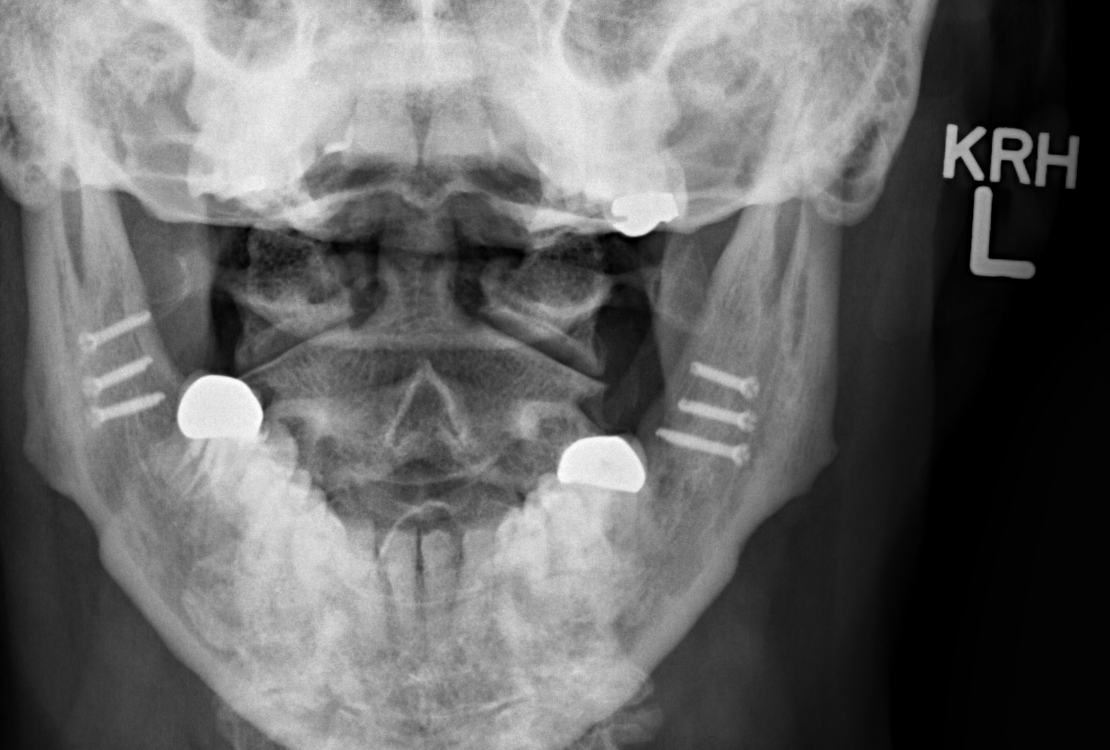

[3 of 3 positions shown; findings below may reference images not displayed]

FINDINGS: Degenerative disc space narrowing is present at the C5-6
and C6-C7 levels.  There are associated osteophytic changes at
these levels.  Extensive bilateral facet joint arthritic changes
are present at the C4-5, C5-6, and C6-7 levels.  The soft tissues
have a normal appearance.
IMPRESSION: Changes of cervical spondylosis with degenerative disc space
narrowing at the C5-C6 and C6-C7 levels.  Bilateral facet joint
arthritic changes as discussed above.

## 2012-02-06 ENCOUNTER — Ambulatory Visit: Payer: PRIVATE HEALTH INSURANCE | Admitting: Endocrinology

## 2012-02-17 ENCOUNTER — Other Ambulatory Visit: Payer: Self-pay | Admitting: Endocrinology

## 2012-02-29 ENCOUNTER — Encounter: Payer: PRIVATE HEALTH INSURANCE | Admitting: Family Medicine

## 2012-04-03 ENCOUNTER — Encounter: Payer: Self-pay | Admitting: Internal Medicine

## 2012-04-14 ENCOUNTER — Other Ambulatory Visit: Payer: Self-pay | Admitting: Endocrinology

## 2012-04-15 NOTE — Telephone Encounter (Signed)
Pt needs OV, called and LMOVM.

## 2012-12-11 IMAGING — US US ABDOMEN COMPLETE
1 series · 13 of 25 positions shown · non-contrast
Comparison: None

CLINICAL DATA: Elevated LFTs and.

COMPLETE ABDOMINAL ULTRASOUND

[Series 1: us abdomen complete · 0.34mm/px · 13 of 88 slices shown]
[im 1/88]
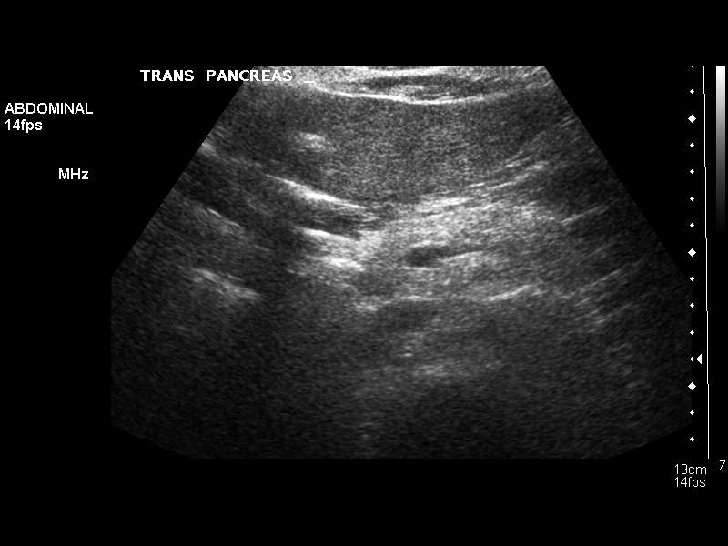
[im 8/88]
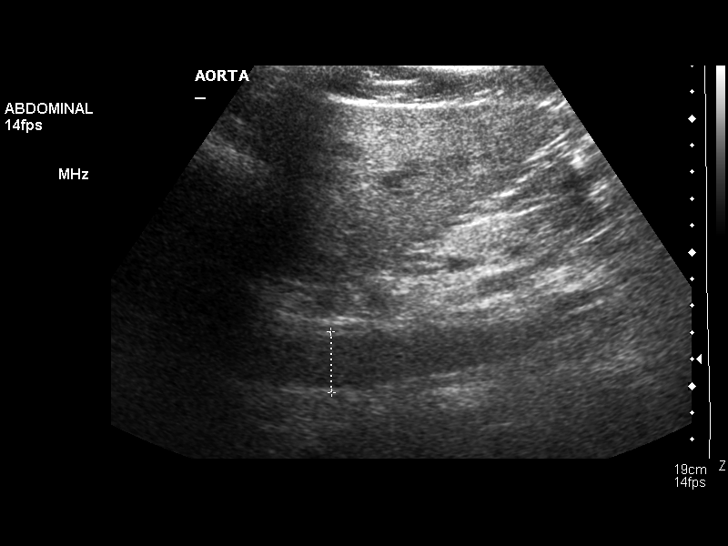
[im 15/88]
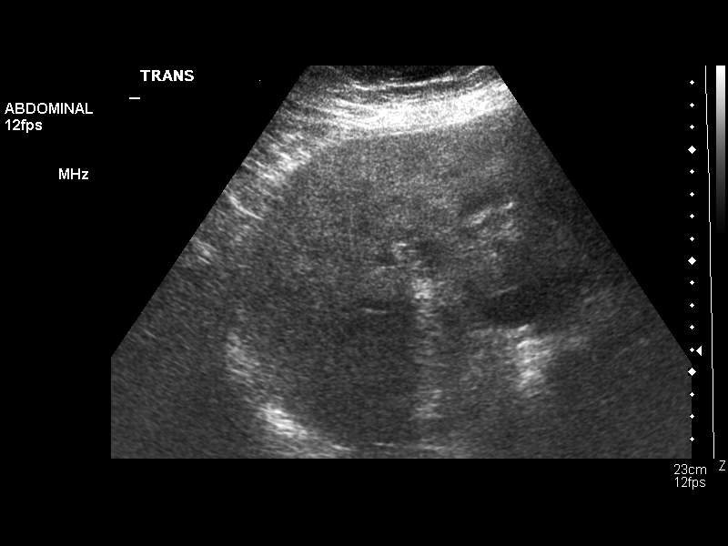
[im 22/88]
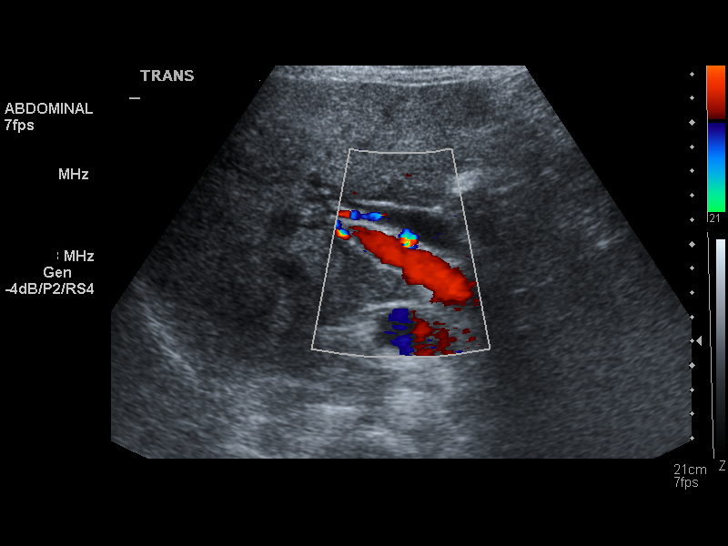
[im 30/88]
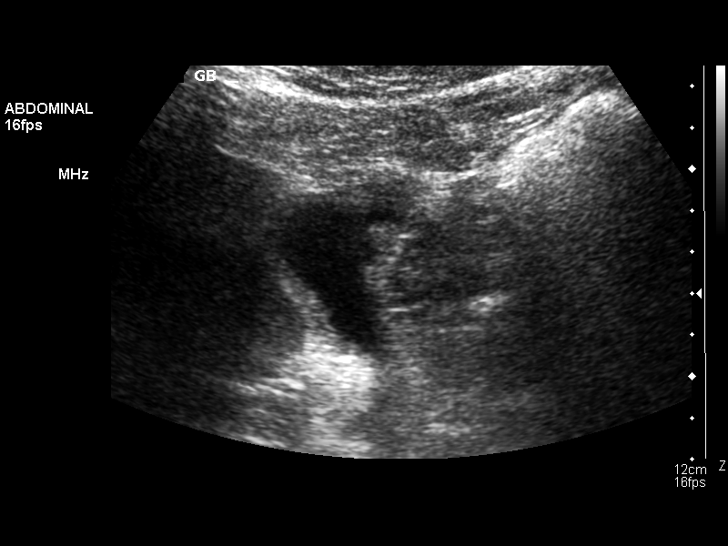
[im 37/88]
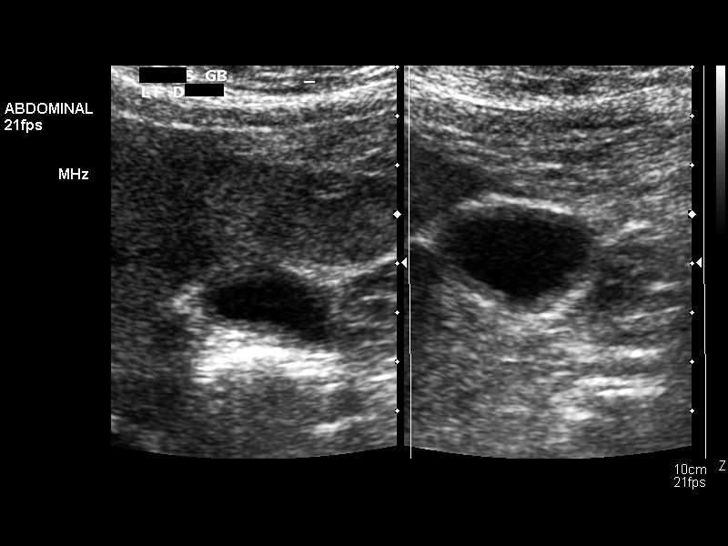
[im 44/88]
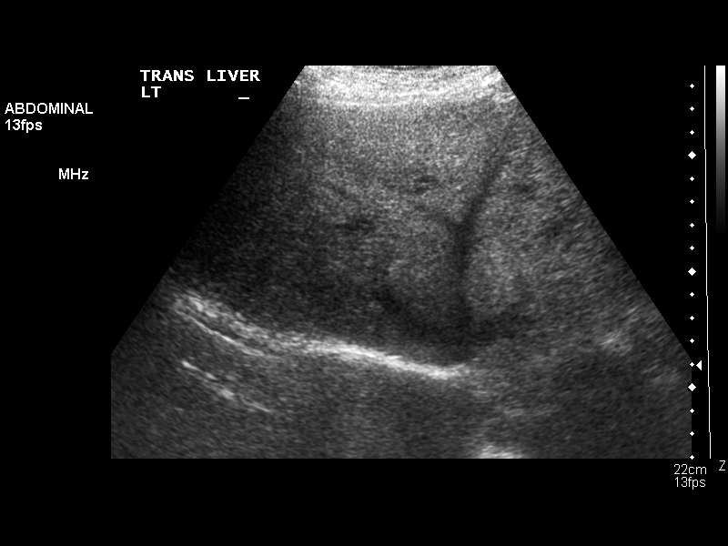
[im 51/88]
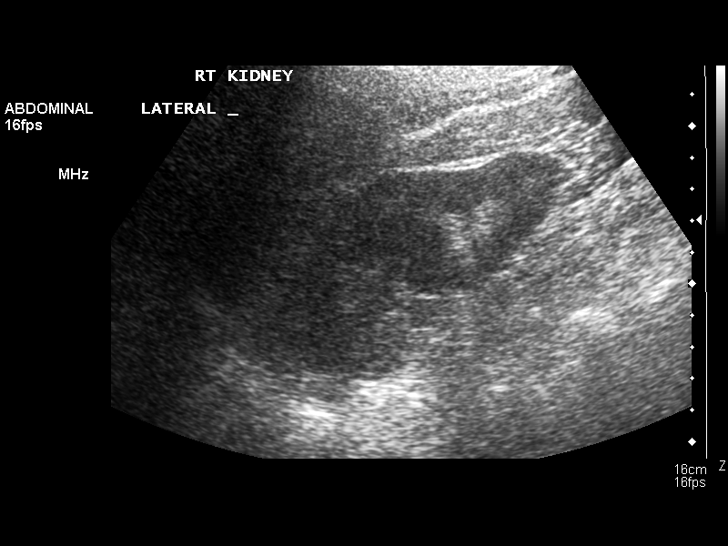
[im 59/88]
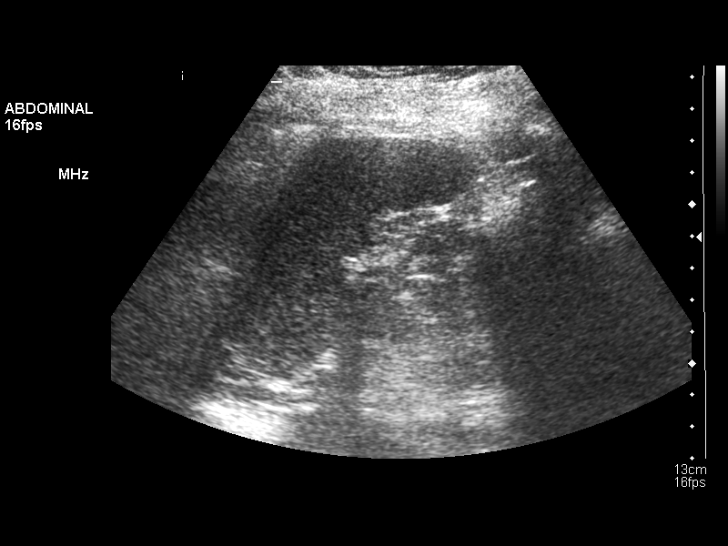
[im 66/88]
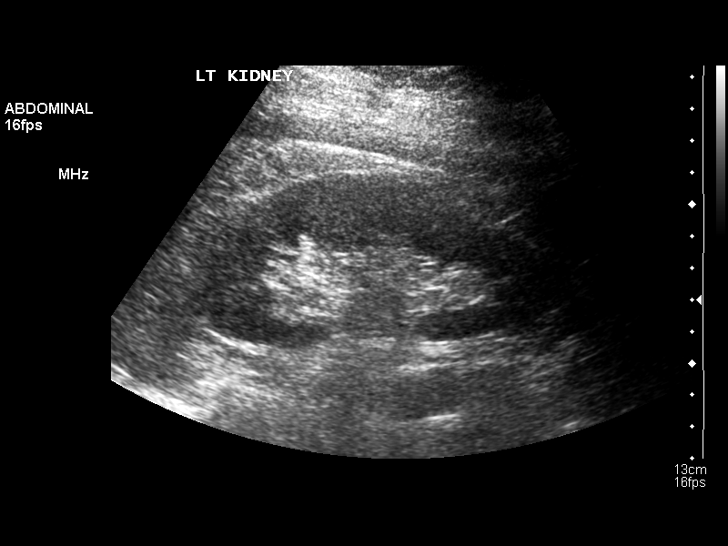
[im 73/88]
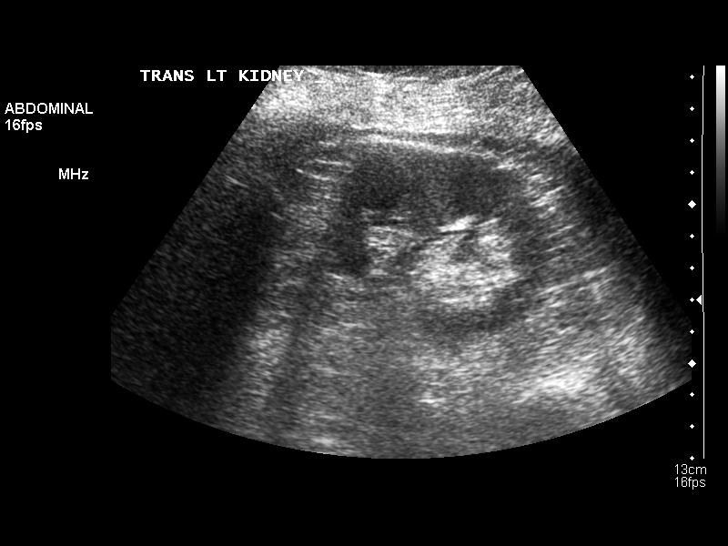
[im 80/88]
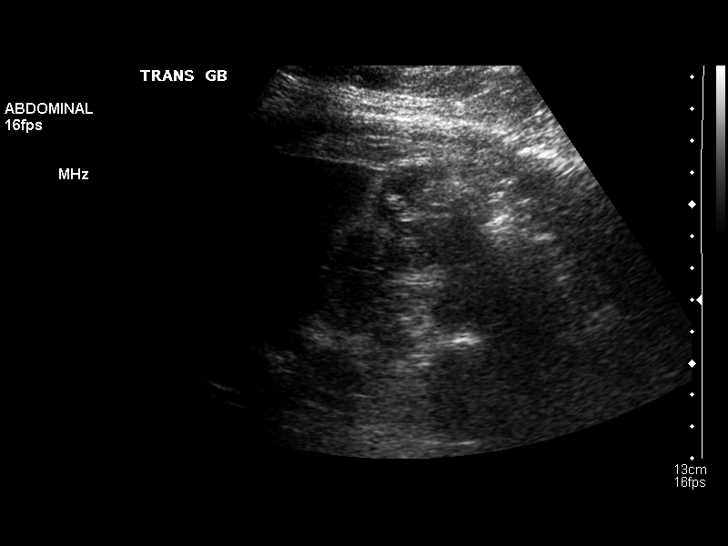
[im 88/88]
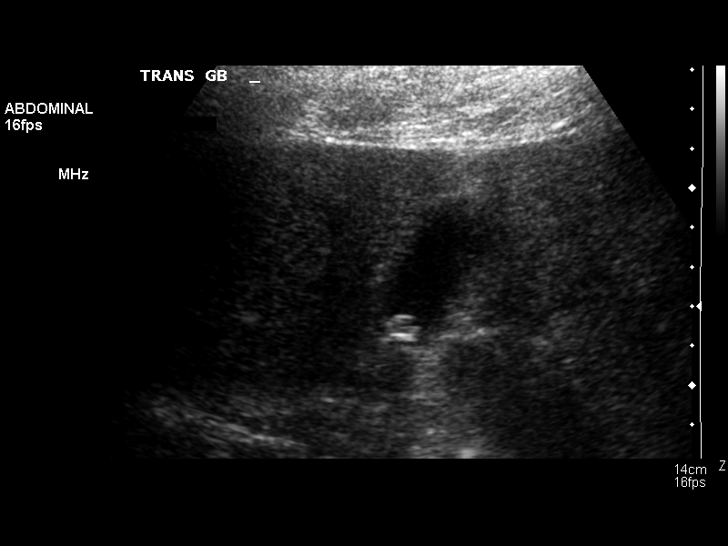

[13 of 25 positions shown; findings below may reference images not displayed]

FINDINGS: Gallbladder:  Several small gallstones 4 mm or less in size.  The
patient was mildly tender over the gallbladder during the study.
Gallbladder wall mildly thickened at 3 mm.

Common bile duct:   Dilated at 15 mm proximally in the porta
hepatis.  Difficult to visualize in the pancreatic head.

Liver:  Heterogeneous, increased echotexture suggesting fatty
infiltration or intrinsic liver disease.  No focal abnormality.  No
intrahepatic biliary ductal dilatation.

IVC:  Appears normal.

Pancreas:  Limited visualization of the head and tail due to bowel
gas.

Spleen:  Within normal limits in size and echotexture.

Right Kidney:   Normal in size and parenchymal echogenicity.  No
evidence of mass or hydronephrosis.

Left Kidney:  Normal in size and parenchymal echogenicity.  No
evidence of mass or hydronephrosis.

Abdominal aorta:  No aneurysm identified.
IMPRESSION: Several small mobile gallstones, 4 mm or less in size.  There is
borderline gallbladder wall thickening.  The patient was mildly
tender over the gallbladder during examination.  Common bile duct
dilated at 15 mm proximally in the porta hepatis.  Cannot exclude
acute cholecystitis.  May consider nuclear medicine hepatobiliary
scan to assess cystic duct and common duct patency.

Heterogeneous increased echotexture throughout the liver suggesting
fatty infiltration or intrinsic liver disease.

## 2020-05-27 ENCOUNTER — Other Ambulatory Visit (HOSPITAL_BASED_OUTPATIENT_CLINIC_OR_DEPARTMENT_OTHER): Payer: Self-pay | Admitting: Internal Medicine

## 2020-05-27 ENCOUNTER — Ambulatory Visit: Payer: Self-pay | Attending: Internal Medicine

## 2020-05-27 DIAGNOSIS — Z23 Encounter for immunization: Secondary | ICD-10-CM

## 2020-05-27 NOTE — Progress Notes (Signed)
° °  Covid-19 Vaccination Clinic  Name:  DARLINE FAITH    MRN: [id number] DOB: [birth year]  05/27/2020  Ms. Gaertner was observed post Covid-19 immunization for 15 minutes without incident. She was provided with Vaccine Information Sheet and instruction to access the V-Safe system.   Ms. Beamon was instructed to call 911 with any severe reactions post vaccine:  Difficulty breathing   Swelling of face and throat   A fast heartbeat   A bad rash all over body   Dizziness and weakness

## 2020-05-30 MED FILL — PFIZER-BIONTECH COVID-19 VA: 30 | 1 days supply | Qty: 0 | Fill #0

## 2022-04-24 ENCOUNTER — Other Ambulatory Visit (HOSPITAL_COMMUNITY): Payer: Self-pay

## 2022-10-30 ENCOUNTER — Other Ambulatory Visit (HOSPITAL_COMMUNITY): Payer: Self-pay

## 2024-01-09 ENCOUNTER — Emergency Department (HOSPITAL_BASED_OUTPATIENT_CLINIC_OR_DEPARTMENT_OTHER)

## 2024-01-09 ENCOUNTER — Encounter (HOSPITAL_BASED_OUTPATIENT_CLINIC_OR_DEPARTMENT_OTHER): Payer: Self-pay

## 2024-01-09 ENCOUNTER — Other Ambulatory Visit: Payer: Self-pay

## 2024-01-09 ENCOUNTER — Emergency Department (HOSPITAL_BASED_OUTPATIENT_CLINIC_OR_DEPARTMENT_OTHER)
Admission: EM | Admit: 2024-01-09 | Discharge: 2024-01-09 | Disposition: A | Discharge status: Home / Self Care | Attending: Emergency Medicine | Admitting: Emergency Medicine

## 2024-01-09 DIAGNOSIS — Z79899 Other long term (current) drug therapy: Secondary | ICD-10-CM | POA: Diagnosis not present

## 2024-01-09 DIAGNOSIS — I10 Essential (primary) hypertension: Secondary | ICD-10-CM | POA: Diagnosis not present

## 2024-01-09 DIAGNOSIS — M545 Low back pain, unspecified: Secondary | ICD-10-CM | POA: Diagnosis present

## 2024-01-09 DIAGNOSIS — Z7982 Long term (current) use of aspirin: Secondary | ICD-10-CM | POA: Insufficient documentation

## 2024-01-09 DIAGNOSIS — E1140 Type 2 diabetes mellitus with diabetic neuropathy, unspecified: Secondary | ICD-10-CM | POA: Diagnosis not present

## 2024-01-09 DIAGNOSIS — M5441 Lumbago with sciatica, right side: Secondary | ICD-10-CM | POA: Insufficient documentation

## 2024-01-09 DIAGNOSIS — Z7984 Long term (current) use of oral hypoglycemic drugs: Secondary | ICD-10-CM | POA: Insufficient documentation

## 2024-01-09 MED ORDER — HYDROCODONE-ACETAMINOPHEN 5-325 MG PO TABS: 1.0000 | ORAL_TABLET | Freq: Four times a day (QID) | ORAL | 0 refills | Status: AC | PRN

## 2024-01-09 MED ORDER — PREDNISONE 10 MG PO TABS: 40.0000 mg | ORAL_TABLET | Freq: Every day | ORAL | 0 refills | Status: AC

## 2024-01-09 MED ORDER — DIPHENHYDRAMINE HCL 25 MG PO CAPS
25.0000 mg | ORAL_CAPSULE | Freq: Once | ORAL | Status: AC
  Administered 2024-01-09: 25 mg via ORAL
  Filled 2024-01-09: qty 1

## 2024-01-09 MED ORDER — HYDROMORPHONE HCL 1 MG/ML IJ SOLN
2.0000 mg | Freq: Once | INTRAMUSCULAR | Status: AC
  Administered 2024-01-09: 2 mg via INTRAMUSCULAR
  Filled 2024-01-09: qty 2

## 2024-01-09 MED ORDER — HYDROCODONE-ACETAMINOPHEN 5-325 MG PO TABS: 1.0000 | ORAL_TABLET | Freq: Four times a day (QID) | ORAL | 0 refills | Status: DC | PRN

## 2024-01-09 NOTE — ED Provider Notes (Addendum)
 [facility] Provider Note   CSN: [id number] Arrival date & time: 01/09/24  1502     Patient presents with: Back Pain   Haley Mullins is a 73 y.o. female.   Patient brought in by EMS.  Patient's had history long-term of back pain but it been worse with radiation into the right leg for the past week.  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  But certainly no weakness no incontinence no involvement of the left leg.  No falls or injuries.  But she has been told that she has osteopenia.  Patient's past medical history significant for fibromyalgia hypertension back pain with radiculopathy in 2011 choledocholithiasis in 2012.  Diabetes Wigraine headaches.  Past surgical history sent for lumbar laminectomy.  Patient is never used tobacco products.  Patient denies any weakness to the right leg.  Just difficult to get around because of the pain.       Prior to Admission medications   Medication Sig Start Date End Date Taking? Authorizing Provider  aspirin 81 MG tablet Take 81 mg by mouth daily.      [provider]  atenolol  (TENORMIN ) 50 MG tablet TAKE ONE TABLET DAILY 01/04/11   Antonio Meth, Yvonne R, DO  atenolol  (TENORMIN ) 50 MG tablet TAKE 1 TABLET (50 MG TOTAL) BY MOUTH DAILY. 09/04/11   Antonio Meth Jamee JONELLE, DO  atenolol  (TENORMIN ) 50 MG tablet TAKE 1 TABLET (50 MG TOTAL) BY MOUTH DAILY. 01/10/12   Antonio Meth Jamee JONELLE, DO  cetirizine (ZYRTEC) 10 MG tablet Take 10 mg by mouth daily.      [provider]  Cholecalciferol (VITAMIN D3) 1000 UNITS CAPS Take by mouth daily.      [provider]  Cinnamon 500 MG TABS Take 2 tablets by mouth daily.      [provider]  Cranberry 400 MG TABS Take 2 tablets by mouth daily.      [provider]  cyclobenzaprine (FLEXERIL) 10 MG tablet TAKE 1 TABLET BY MOUTH 3 TIMES A DAY AS NEEDED  06/04/11   Antonio Meth, Yvonne R, DO  glipiZIDE  (GLUCOTROL  XL) 10 MG 24 hr tablet TAKE 1 TABLET BY MOUTH TWICE A DAY. 12/27/11   Kassie Mallick, MD  glucose blood (ONE TOUCH ULTRA TEST) test strip 1 each by Other route as needed. Use as instructed     [provider]  JANUVIA 100 MG tablet TAKE 1 TABLET BY MOUTH EVERY MORNING APPT NEEDED FOR FURTHER REFILLS 01/13/12   Kassie Mallick, MD  lisinopril  (PRINIVIL ,ZESTRIL ) 10 MG tablet Take 1 tablet (10 mg total) by mouth daily. 01/03/12   Lowne Chase, Yvonne R, DO  Melatonin 5 MG TABS Take by mouth daily.      [provider]  metFORMIN  (GLUCOPHAGE ) 500 MG tablet TAKE 2 TABLETS TWICE A DAY 12/27/11   Kassie Mallick, MD  Multiple Vitamin (MULTIVITAMIN) capsule Take 1 capsule by mouth daily.      [provider]  omeprazole  (PRILOSEC  OTC) 20 MG tablet Take 1 tablet (20 mg total) by mouth daily. 01/02/11   Antonio Meth Jamee JONELLE, DO  omeprazole  (PRILOSEC ) 40 MG capsule TAKE ONE CAPSULE BY MOUTH DAILY 07/19/11   Kassie Mallick, MD  [facility] LANCETS MISC by Does not apply route daily.      [provider]  pioglitazone  (ACTOS ) 45 MG tablet  TAKE 1 TABLET EVERY DAY (NEED APPT) 01/13/12   Kassie Mallick, MD  pravastatin  (PRAVACHOL ) 40 MG tablet Take 1 tablet (40 mg total) by mouth daily. 01/03/12   Lowne Chase, Yvonne R, DO  promethazine (PHENERGAN) 25 MG tablet Take 25 mg by mouth every 6 (six) hours as needed.      [provider]  Specialty Vitamins Products (MENOPAUSE SUPPORT PO) Take by mouth daily.      [provider]  SUMAtriptan  (IMITREX ) 100 MG tablet Take 1 tablet (100 mg total) by mouth once as needed for migraine. 01/09/11 01/09/12  Antonio Cyndee Jamee JONELLE, DO  traMADol  (ULTRAM ) 50 MG tablet TAKE 1 TO 2 TABLETS EVERY 6 HOURS AS NEEDED 01/02/11   Antonio Cyndee, Yvonne R, DO  triamcinolone (NASACORT) 55 MCG/ACT nasal inhaler INHALE TWO SPRAYS IN EACH NOSTRIL DAILY 11/25/10   Lowne Chase, Yvonne R, DO  WELCHOL  625  MG tablet TAKE 6 TABLETS ONCE DAILY 01/13/12   Kassie Mallick, MD    Allergies: Patient has no known allergies.    Review of Systems  Constitutional:  Negative for chills and fever.  HENT:  Negative for ear pain and sore throat.   Eyes:  Negative for pain and visual disturbance.  Respiratory:  Negative for cough and shortness of breath.   Cardiovascular:  Negative for chest pain and palpitations.  Gastrointestinal:  Negative for abdominal pain and vomiting.  Genitourinary:  Negative for dysuria and hematuria.  Musculoskeletal:  Positive for back pain. Negative for arthralgias.  Skin:  Negative for color change and rash.  Neurological:  Negative for seizures and syncope.  All other systems reviewed and are negative.   Updated Vital Signs BP (!) 151/66   Pulse 75   Temp 97.8 F (36.6 C) (Oral)   Resp 18   Wt 104.3 kg   SpO2 96%   Physical Exam Vitals and nursing note reviewed.  Constitutional:      General: She is not in acute distress.    Appearance: Normal appearance. She is well-developed.  HENT:     Head: Normocephalic and atraumatic.   Eyes:     Extraocular Movements: Extraocular movements intact.     Conjunctiva/sclera: Conjunctivae normal.     Pupils: Pupils are equal, round, and reactive to light.    Cardiovascular:     Rate and Rhythm: Normal rate and regular rhythm.     Heart sounds: No murmur heard. Pulmonary:     Effort: Pulmonary effort is normal. No respiratory distress.     Breath sounds: Normal breath sounds.  Abdominal:     Palpations: Abdomen is soft.     Tenderness: There is no abdominal tenderness.   Musculoskeletal:        General: No swelling.     Cervical back: Normal range of motion and neck supple.     Right lower leg: No edema.     Left lower leg: No edema.   Skin:    General: Skin is warm and dry.     Capillary Refill: Capillary refill takes less than 2 seconds.   Neurological:     General: No focal deficit present.     Mental  Status: She is alert and oriented to person, place, and time.     Cranial Nerves: No cranial nerve deficit.     Sensory: No sensory deficit.     Motor: No weakness.   Psychiatric:        Mood and Affect: Mood normal.     (  all labs ordered are listed, but only abnormal results are displayed) Labs Reviewed - No data to display  EKG: None  Radiology: No results found.   Procedures   Medications Ordered in the ED  HYDROmorphone (DILAUDID) injection 2 mg (2 mg Intramuscular Given 01/09/24 1611)                                    Medical Decision Making Amount and/or Complexity of Data Reviewed Labs: ordered. Radiology: ordered.  Risk Prescription drug management.   Symptoms seem to be consistent with right sided back pain radiating into the right leg which would go along with sciatica.  But no obvious motor deficit.  No incontinence.  No concerns for cauda equina.  Based on this we will get CT lumbar study.  Will give 2 mg of Dilaudid IM for pain relief.  Patient not currently on steroids has been able to take steroids in the past.  CT lumbar spine without any acute findings certainly no evidence of any fracture or any lesions.  Will treat patient symptomatically.  Final diagnoses:  Acute right-sided low back pain with right-sided sciatica    ED Discharge Orders     None          Geraldene Hamilton, MD 01/09/24 1649    Geraldene Hamilton, MD 01/09/24 1739

## 2024-01-09 NOTE — Discharge Instructions (Addendum)
 Take pain medication as directed.  Take the prednisone as directed.  Make an appointment to follow-up with your back doctor.  Return for any new or worse symptoms.  CT scan lumbar back without any acute findings.

## 2024-01-09 NOTE — ED Triage Notes (Signed)
 Pt brought in by EMS for back pain. Reports sat on toilet too long as well as in chair 10 days ago. Reports pain right lower back that radiates down right leg. Pt reports has been in bed since last Tuesday. Using walker to get to BR.Denies any recent falls [facility] xray on Tuesday and thought possible pelvic fx

## 2024-02-11 ENCOUNTER — Ambulatory Visit: Admitting: Physical Medicine and Rehabilitation

## 2024-02-27 ENCOUNTER — Ambulatory Visit: Admitting: Physical Medicine and Rehabilitation

## 2024-03-19 NOTE — Progress Notes (Signed)
 Location Information: Patient State (at time of visit): [place]  Patient Location (at time of visit):Home/Other Non-Medical  Provider Location: Non-Provider-Based Clinic (Clinic, non-hospital) Is provider licensed to provide clinical care in the current location/state of the patient? Yes   Consent:  Patient's identity was confirmed. Presenting condition or illness was discussed with the patient/personal representative. Current proposed treatment for presenting condition or illness was explained to patient/personal representative along with the likely benefits and any significant risks or complications associated with the provision of treatment by audio/video means. The patient/personal representative verbally authorized treatment to be provided by audio/video, which may include a limited review of patient's current health status, medication, or other treatment recommendations, patient education, and an opportunity to ask questions about condition and treatment. Verbal Consent Granted by Patient/Personal Representative:Yes   Visit Information: Modality: 2-Way Real-Time Audio/Video  Video Start Time: 250 Video Stop Time: 300 Video Total Time: 10 minutes  Mrs. Haley Mullins is a 73 year old female who returns for management of DM2.   She has had DM for over 20 years. She has family history of DM in her mother and grandparents. She has HTN and HLD. She was diagnosed with endometrial cancer.   She had a total hysterectomy in 02/2020.   Her A1C was 10.8% in 01/2020. Her creatinine was 0.75 with a GFR of 81 in 12/2019. She was initially seen in 02/2020.  Her A1C was 9.8% at that time. She was started on a trial with OZEMPIC.  Unfortunately it was stopped in 04/2020 due to abdominal issues.  She was last seen in 10/2023.    Her A1C at last visit was 7.7%.    Her A1C is UNKNOWN.   She brought her meter.   She is checking her sugars 2 times daily. Her sugars have ranged 66- 180. She  denies low sugars.  She is taking basaglar 26 units twice daily.   She is taking metformin .  She is taking ozempic 0.25 mg weekly on Monday.   She denies polyuria or polydipsia. She has nocturia x 1+.    She has no history of pancreatitis.  The patient problem list, PMHx, Surgical Hx, Med list, Allergies, Family history and Personal History were reviewed during this visit.  ROS: GEN: no fever CV: no chest pain, no SOB PULM: no cough, no DOE GI: no nausea no vomiting, + chronic abdominal pain GU: no dysuria, no hematuria NEURO: no HA, no syncope  PE: General- well nourished, obese, pleasant female Neck: thyroid not enlarged ,  Eyes: sclera clear, conjunctiva clear Lymph nodes:  Lungs: normal respiratory movement CV:  Abdomen: distended MSK: normal movement of extremities Neuro: oriented, no motor dysfunction Skin: no visible lesions  ASSESSMENT AND PLAN:  DM2 with neuropathy- A1C will need to be checked by PCP Continue basaglar- continue 26 units twice daily Reported sugars are stable Check sugars twice daily Continue OZEMPIC - she will take below the 0.25 mg amount- we discussed a potential increase in dose for weight loss Continue metformin  Continue gabapentin for neuropathy  Follow up in 6 months for video visit

## 2024-03-31 ENCOUNTER — Ambulatory Visit: Admitting: Physical Medicine and Rehabilitation

## 2024-04-13 ENCOUNTER — Ambulatory Visit: Admitting: Physical Medicine and Rehabilitation

## 2024-05-11 ENCOUNTER — Ambulatory Visit: Admitting: Physical Medicine and Rehabilitation

## 2024-05-18 ENCOUNTER — Encounter: Payer: Self-pay | Admitting: Radiology

## 2024-05-28 ENCOUNTER — Ambulatory Visit: Admitting: Physical Medicine and Rehabilitation
